# Patient Record
Sex: Female | Born: 1975 | Race: Black or African American | Hispanic: No | Marital: Single | Smoking: Current some day smoker
Health system: Southern US, Community
[De-identification: ages and names within clinical notes are randomized; demographics above are authoritative.]

## PROBLEM LIST (undated history)

## (undated) DIAGNOSIS — I10 Essential (primary) hypertension: Secondary | ICD-10-CM

## (undated) HISTORY — DX: Essential (primary) hypertension: I10

## (undated) HISTORY — PX: TUBAL LIGATION: SHX77

---

## 1997-03-04 ENCOUNTER — Inpatient Hospital Stay (HOSPITAL_COMMUNITY): Admission: AD | Admit: 1997-03-04 | Discharge: 1997-03-04 | Payer: Self-pay | Admitting: Obstetrics

## 1997-04-17 ENCOUNTER — Inpatient Hospital Stay (HOSPITAL_COMMUNITY): Admission: AD | Admit: 1997-04-17 | Discharge: 1997-04-17 | Payer: Self-pay | Admitting: Obstetrics

## 1997-04-18 ENCOUNTER — Inpatient Hospital Stay (HOSPITAL_COMMUNITY): Admission: AD | Admit: 1997-04-18 | Discharge: 1997-04-20 | Payer: Self-pay | Admitting: Obstetrics

## 1998-10-22 ENCOUNTER — Inpatient Hospital Stay (HOSPITAL_COMMUNITY): Admission: AD | Admit: 1998-10-22 | Discharge: 1998-10-22 | Payer: Self-pay | Admitting: Obstetrics & Gynecology

## 1999-06-14 ENCOUNTER — Emergency Department (HOSPITAL_COMMUNITY): Admission: EM | Admit: 1999-06-14 | Discharge: 1999-06-15 | Payer: Self-pay | Admitting: Emergency Medicine

## 1999-08-08 ENCOUNTER — Inpatient Hospital Stay (HOSPITAL_COMMUNITY): Admission: AD | Admit: 1999-08-08 | Discharge: 1999-08-08 | Payer: Self-pay | Admitting: Obstetrics

## 1999-09-08 ENCOUNTER — Encounter: Payer: Self-pay | Admitting: Obstetrics

## 1999-09-08 ENCOUNTER — Inpatient Hospital Stay (HOSPITAL_COMMUNITY): Admission: AD | Admit: 1999-09-08 | Discharge: 1999-09-09 | Payer: Self-pay | Admitting: Obstetrics

## 1999-10-08 ENCOUNTER — Emergency Department (HOSPITAL_COMMUNITY): Admission: EM | Admit: 1999-10-08 | Discharge: 1999-10-08 | Payer: Self-pay | Admitting: Emergency Medicine

## 1999-10-13 ENCOUNTER — Other Ambulatory Visit: Admission: RE | Admit: 1999-10-13 | Discharge: 1999-10-13 | Payer: Self-pay | Admitting: Obstetrics

## 2000-02-20 ENCOUNTER — Inpatient Hospital Stay (HOSPITAL_COMMUNITY): Admission: AD | Admit: 2000-02-20 | Discharge: 2000-02-20 | Payer: Self-pay | Admitting: Obstetrics

## 2000-04-03 ENCOUNTER — Observation Stay (HOSPITAL_COMMUNITY): Admission: AD | Admit: 2000-04-03 | Discharge: 2000-04-03 | Payer: Self-pay | Admitting: Obstetrics

## 2000-04-09 ENCOUNTER — Inpatient Hospital Stay (HOSPITAL_COMMUNITY): Admission: AD | Admit: 2000-04-09 | Discharge: 2000-04-13 | Payer: Self-pay | Admitting: Obstetrics

## 2000-04-12 ENCOUNTER — Encounter: Payer: Self-pay | Admitting: Obstetrics

## 2002-04-15 ENCOUNTER — Emergency Department (HOSPITAL_COMMUNITY): Admission: EM | Admit: 2002-04-15 | Discharge: 2002-04-15 | Payer: Self-pay | Admitting: *Deleted

## 2003-04-07 ENCOUNTER — Emergency Department (HOSPITAL_COMMUNITY): Admission: EM | Admit: 2003-04-07 | Discharge: 2003-04-07 | Payer: Self-pay | Admitting: *Deleted

## 2003-12-01 ENCOUNTER — Inpatient Hospital Stay (HOSPITAL_COMMUNITY): Admission: AD | Admit: 2003-12-01 | Discharge: 2003-12-01 | Payer: Self-pay | Admitting: Obstetrics and Gynecology

## 2007-07-29 ENCOUNTER — Inpatient Hospital Stay (HOSPITAL_COMMUNITY): Admission: AD | Admit: 2007-07-29 | Discharge: 2007-07-30 | Payer: Self-pay | Admitting: Gynecology

## 2007-09-18 ENCOUNTER — Emergency Department (HOSPITAL_COMMUNITY): Admission: EM | Admit: 2007-09-18 | Discharge: 2007-09-18 | Payer: Self-pay | Admitting: Emergency Medicine

## 2007-12-01 ENCOUNTER — Emergency Department (HOSPITAL_COMMUNITY): Admission: EM | Admit: 2007-12-01 | Discharge: 2007-12-01 | Payer: Self-pay | Admitting: Emergency Medicine

## 2008-03-04 ENCOUNTER — Emergency Department (HOSPITAL_COMMUNITY): Admission: EM | Admit: 2008-03-04 | Discharge: 2008-03-04 | Payer: Self-pay | Admitting: Emergency Medicine

## 2008-05-07 ENCOUNTER — Emergency Department (HOSPITAL_COMMUNITY): Admission: EM | Admit: 2008-05-07 | Discharge: 2008-05-07 | Payer: Self-pay | Admitting: Emergency Medicine

## 2008-06-26 ENCOUNTER — Emergency Department (HOSPITAL_COMMUNITY): Admission: EM | Admit: 2008-06-26 | Discharge: 2008-06-26 | Payer: Self-pay | Admitting: Emergency Medicine

## 2008-10-03 ENCOUNTER — Emergency Department (HOSPITAL_COMMUNITY): Admission: EM | Admit: 2008-10-03 | Discharge: 2008-10-03 | Payer: Self-pay | Admitting: Emergency Medicine

## 2009-04-23 ENCOUNTER — Emergency Department (HOSPITAL_COMMUNITY): Admission: EM | Admit: 2009-04-23 | Discharge: 2009-04-23 | Payer: Self-pay | Admitting: Emergency Medicine

## 2009-04-29 ENCOUNTER — Emergency Department (HOSPITAL_COMMUNITY): Admission: EM | Admit: 2009-04-29 | Discharge: 2009-04-29 | Payer: Self-pay | Admitting: Emergency Medicine

## 2009-05-06 ENCOUNTER — Emergency Department (HOSPITAL_COMMUNITY): Admission: EM | Admit: 2009-05-06 | Discharge: 2009-05-06 | Payer: Self-pay | Admitting: Emergency Medicine

## 2009-08-08 ENCOUNTER — Emergency Department (HOSPITAL_COMMUNITY): Admission: EM | Admit: 2009-08-08 | Discharge: 2009-08-08 | Payer: Self-pay | Admitting: Emergency Medicine

## 2009-09-05 ENCOUNTER — Emergency Department (HOSPITAL_COMMUNITY): Admission: EM | Admit: 2009-09-05 | Discharge: 2009-09-05 | Payer: Self-pay | Admitting: Emergency Medicine

## 2009-11-03 ENCOUNTER — Emergency Department (HOSPITAL_COMMUNITY): Admission: EM | Admit: 2009-11-03 | Discharge: 2009-11-03 | Payer: Self-pay | Admitting: Emergency Medicine

## 2009-12-31 ENCOUNTER — Emergency Department (HOSPITAL_COMMUNITY)
Admission: EM | Admit: 2009-12-31 | Discharge: 2009-12-31 | Payer: Self-pay | Source: Home / Self Care | Admitting: Emergency Medicine

## 2010-03-01 ENCOUNTER — Emergency Department (HOSPITAL_COMMUNITY): Payer: Self-pay

## 2010-03-01 ENCOUNTER — Emergency Department (HOSPITAL_COMMUNITY)
Admission: EM | Admit: 2010-03-01 | Discharge: 2010-03-01 | Disposition: A | Discharge status: Home / Self Care | Payer: Self-pay | Attending: Emergency Medicine | Admitting: Emergency Medicine

## 2010-03-01 DIAGNOSIS — R059 Cough, unspecified: Secondary | ICD-10-CM | POA: Insufficient documentation

## 2010-03-01 DIAGNOSIS — R05 Cough: Secondary | ICD-10-CM | POA: Insufficient documentation

## 2010-03-01 DIAGNOSIS — J3489 Other specified disorders of nose and nasal sinuses: Secondary | ICD-10-CM | POA: Insufficient documentation

## 2010-03-01 DIAGNOSIS — R51 Headache: Secondary | ICD-10-CM | POA: Insufficient documentation

## 2010-03-01 DIAGNOSIS — I10 Essential (primary) hypertension: Secondary | ICD-10-CM | POA: Insufficient documentation

## 2010-03-01 DIAGNOSIS — J40 Bronchitis, not specified as acute or chronic: Secondary | ICD-10-CM | POA: Insufficient documentation

## 2010-03-01 DIAGNOSIS — IMO0001 Reserved for inherently not codable concepts without codable children: Secondary | ICD-10-CM | POA: Insufficient documentation

## 2010-03-01 DIAGNOSIS — R6883 Chills (without fever): Secondary | ICD-10-CM | POA: Insufficient documentation

## 2010-04-16 LAB — POCT I-STAT, CHEM 8
Creatinine, Ser: 0.7 mg/dL (ref 0.4–1.2)
Glucose, Bld: 85 mg/dL (ref 70–99)
HCT: 40 % (ref 36.0–46.0)
Hemoglobin: 13.6 g/dL (ref 12.0–15.0)
Sodium: 142 mEq/L (ref 135–145)
TCO2: 24 mmol/L (ref 0–100)

## 2010-04-30 LAB — URINE CULTURE

## 2010-04-30 LAB — BASIC METABOLIC PANEL
GFR calc Af Amer: >60 mL/min (ref >60)
GFR calc non Af Amer: >60 mL/min (ref >60)
Potassium: 3.4 mEq/L — ABNORMAL LOW (ref 3.5–5.1)
Sodium: 139 mEq/L (ref 135–145)

## 2010-04-30 LAB — URINE MICROSCOPIC-ADD ON

## 2010-04-30 LAB — DIFFERENTIAL
Eosinophils Relative: 3 % (ref 0–5)
Lymphocytes Relative: 32 % (ref 12–46)
Lymphs Abs: 2 10*3/uL (ref 0.7–4.0)
Monocytes Absolute: 0.2 10*3/uL (ref 0.1–1.0)

## 2010-04-30 LAB — URINALYSIS, ROUTINE W REFLEX MICROSCOPIC
Glucose, UA: NEGATIVE mg/dL
Ketones, ur: NEGATIVE mg/dL
Protein, ur: NEGATIVE mg/dL

## 2010-04-30 LAB — CBC
HCT: 35.2 % — ABNORMAL LOW (ref 36.0–46.0)
Hemoglobin: 12 g/dL (ref 12.0–15.0)
Platelets: 171 10*3/uL (ref 150–400)
RBC: 3.71 MIL/uL — ABNORMAL LOW (ref 3.87–5.11)
WBC: 6.4 10*3/uL (ref 4.0–10.5)

## 2010-05-05 LAB — URINALYSIS, ROUTINE W REFLEX MICROSCOPIC
Nitrite: NEGATIVE
Specific Gravity, Urine: 1.028 (ref 1.005–1.030)
Urobilinogen, UA: 1 mg/dL (ref 0.0–1.0)

## 2010-05-05 LAB — URINE CULTURE: Colony Count: 40000

## 2010-05-05 LAB — URINE MICROSCOPIC-ADD ON

## 2010-05-11 LAB — POCT URINALYSIS DIP (DEVICE)
Hgb urine dipstick: NEGATIVE
Protein, ur: NEGATIVE mg/dL
Specific Gravity, Urine: 1.02 (ref 1.005–1.030)
Urobilinogen, UA: 0.2 mg/dL (ref 0.0–1.0)
pH: 6 (ref 5.0–8.0)

## 2010-06-11 NOTE — Op Note (Signed)
[facility]  Patient:    Dawn Walsh, Dawn Walsh                     MRN: [id number] Proc. Date: 04/10/00 Adm. Date:  [id number] Attending:  Venita Sheffield                           Operative Report  PREOPERATIVE DIAGNOSIS:       Multiparity.  POSTOPERATIVE DIAGNOSIS:  OPERATION:                    Postpartum tubal using epidural.  SURGEON:                      Kathreen Cosier, M.D.  ASSISTANT:  ANESTHESIA:                   Epidural.  ESTIMATED BLOOD LOSS:  DESCRIPTION OF PROCEDURE:     With the patient in the supine position, the abdomen was prepped and draped.  The bladder was emptied with a straight catheter.  Midline subumbilical incision one inch long was made and carried down to the fascia.  The fascia was cleaned and grasped with two Kochers.  The fascia and peritoneum were opened with Mayo scissors.  The left tube grasped in the midportion with the Babcock clamp.  0 plain suture placed in the mesosalpinx below the portion of the tube within the clamp.  This was transected.  After the pedicle was tied with 0 plain suture.  Hemostasis was satisfactory.  The procedure was done in similar fashion on the other side. Abdomen closed in layers.  Peritoneum and fascia with continuous suture of 0 Dexon.  The skin closed with subcuticular stitch of 3-0 plain.  The patient tolerated the procedure well and was taken to the recovery room in good condition. DD:  04/10/00 TD:  04/10/00 Job: [id number]

## 2010-06-11 NOTE — Discharge Summary (Signed)
[facility]  Patient:    Dawn Walsh, Dawn Walsh                     MRN: [id number] Adm. Date:  [id number] Disc. Date: [id number] Attending:  Venita Sheffield                           Discharge Summary  HISTORY OF PRESENT ILLNESS:   The patient is a 35 year old, gravida 3, para 3-0-3, with a normal vaginal delivery on April 09, 2000.  She desires tubal ligation and on April 10, 2000 underwent postpartum tubal.  She started spiking a temperature of 101.0 or greater on the evening of April 11, 2000.  She was started on ampicillin p.o. q.6h. but continued spiking.  On April 12, 2000, she was started on IV ampicillin, Cleocin, and gentamicin. She had an HIV test which was negative.  Her ultrasound was done checking for routine polyps and the ultrasound was negative as was pathology.  Her abdomen is soft.  She had good bowel sounds. She moved about.  No nausea, vomiting, no CVA tenderness.  Urine was sent and she had a 11 to 20 white blood cells.  Her hemoglobin on admission was 10.4, white count 15,000.  Daily CBCs showed her white count never exceeded 15,000 and came down to 10,000 on March 25, 2000.  The remainder of her labs were normal.  The HIV was normal.  Physical exam was within normal limits.  Pelvic exam showed the lochia was nonfoul smelling and normal in appearance.  The patient was seen in consult with infectious diseases, Dr. Tinnie Gens C. Hatcher.  Their examination was also negative.  The patient had multiple social problems.  She stated that she had no one to take care of her children at home among other problems and was very insistent about being discharged.  I consulted with Dr. Tinnie Gens C. Hatcher on April 13, 2000 and he agreed we could send her home on Cleocin 300 mg every six hours, p.o. ampicillin 500 mg p.o. every six hours.  The patient is to take her temperature every four to six hours at home and if her temperature is 101.0  or greater she is to return to the hospital for admission.  She is also to follow up in my office on Monday morning, for a temperature check, and re-examination.  DISCHARGE DIAGNOSES:          1. Status post normal vaginal delivery.                               2. Postpartum tubal ligation. DD:  04/13/00 TD:  04/13/00 Job: [id number]

## 2010-09-05 IMAGING — CR DG LUMBAR SPINE COMPLETE 4+V
5 series · 5 of 5 positions shown · non-contrast
Comparison: Abdominal radiographs 05/07/2008

CLINICAL DATA: Back pain and lower abdominal pain

LUMBAR SPINE - COMPLETE 4+ VIEW

[t l-spine a.p.]
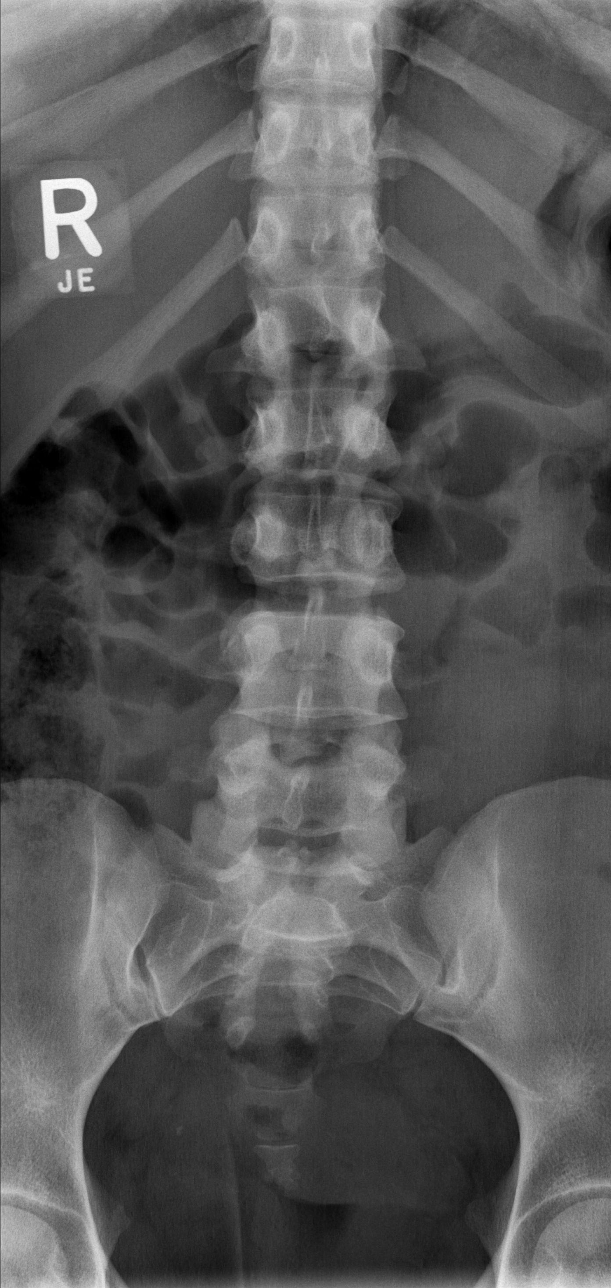

[t l-spine oblique exposure (1 of 2)]
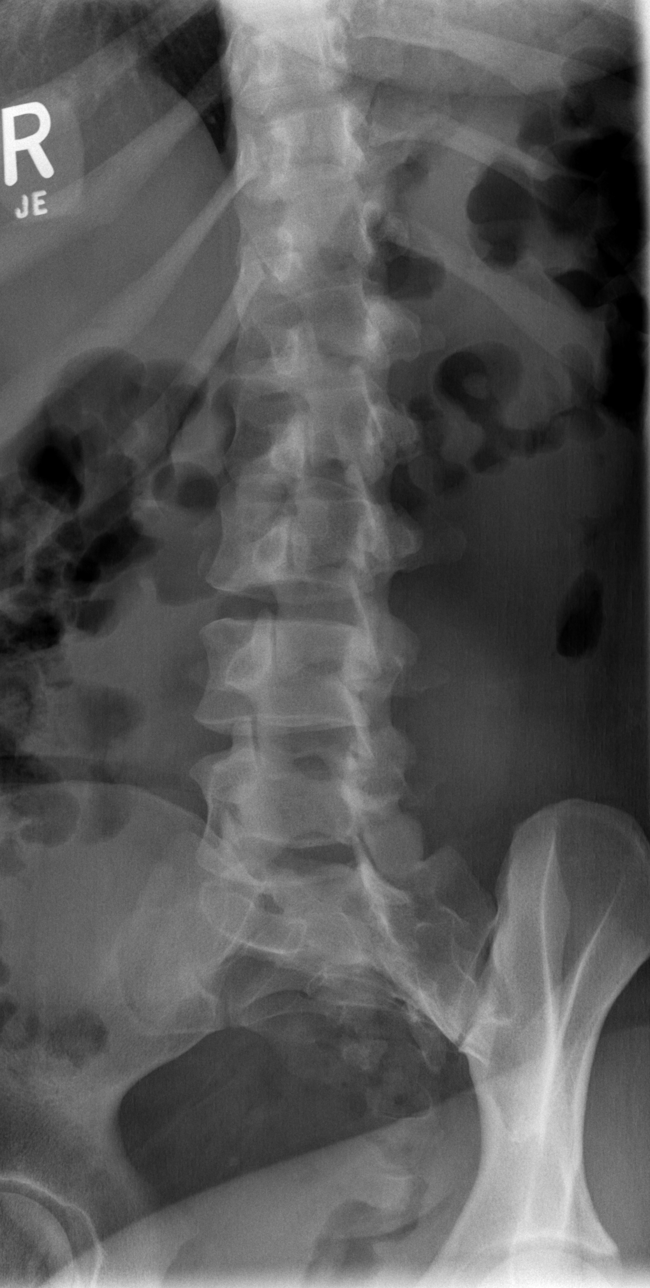

[t l-spine oblique exposure (2 of 2)]
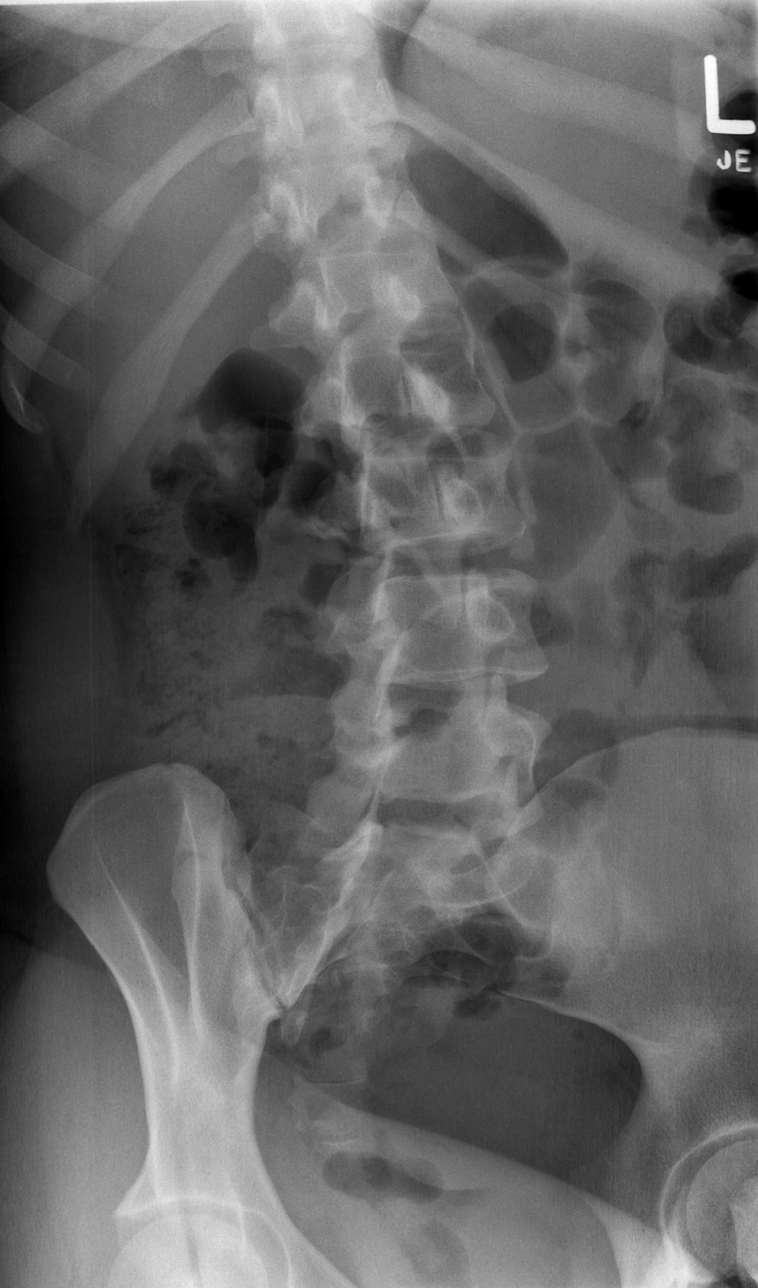

[t l-spine lat *]
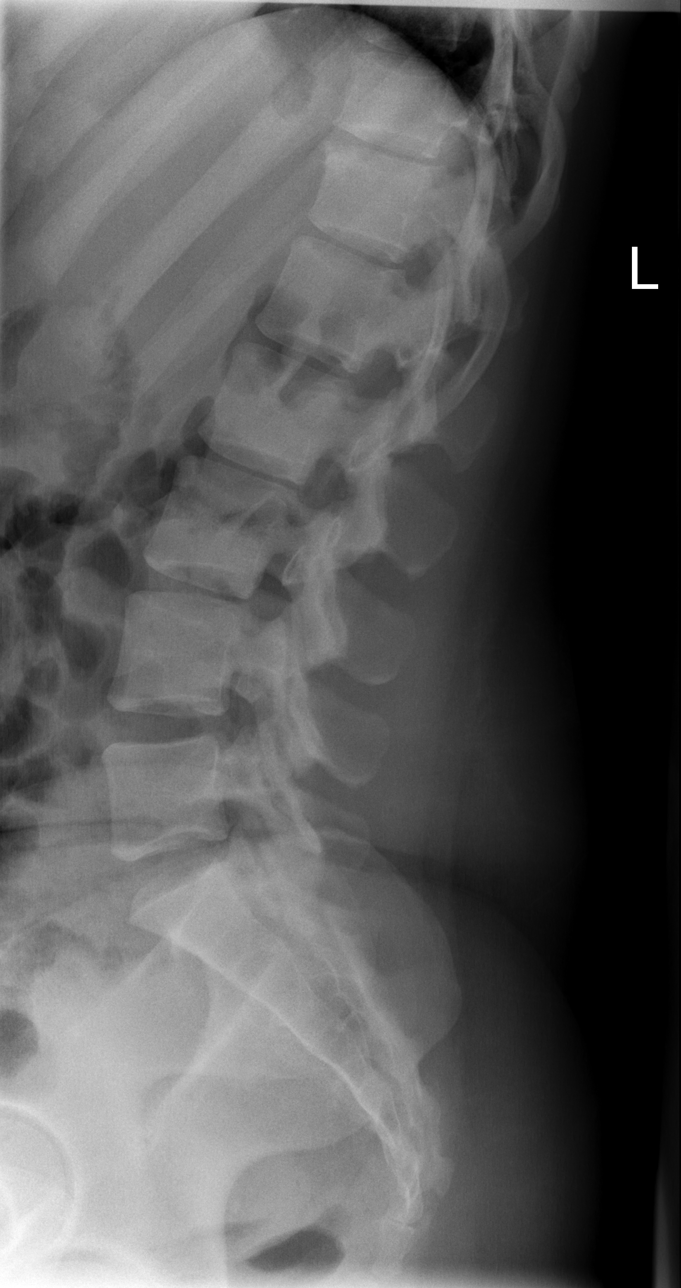

[t l-spine l5-s1 spot]
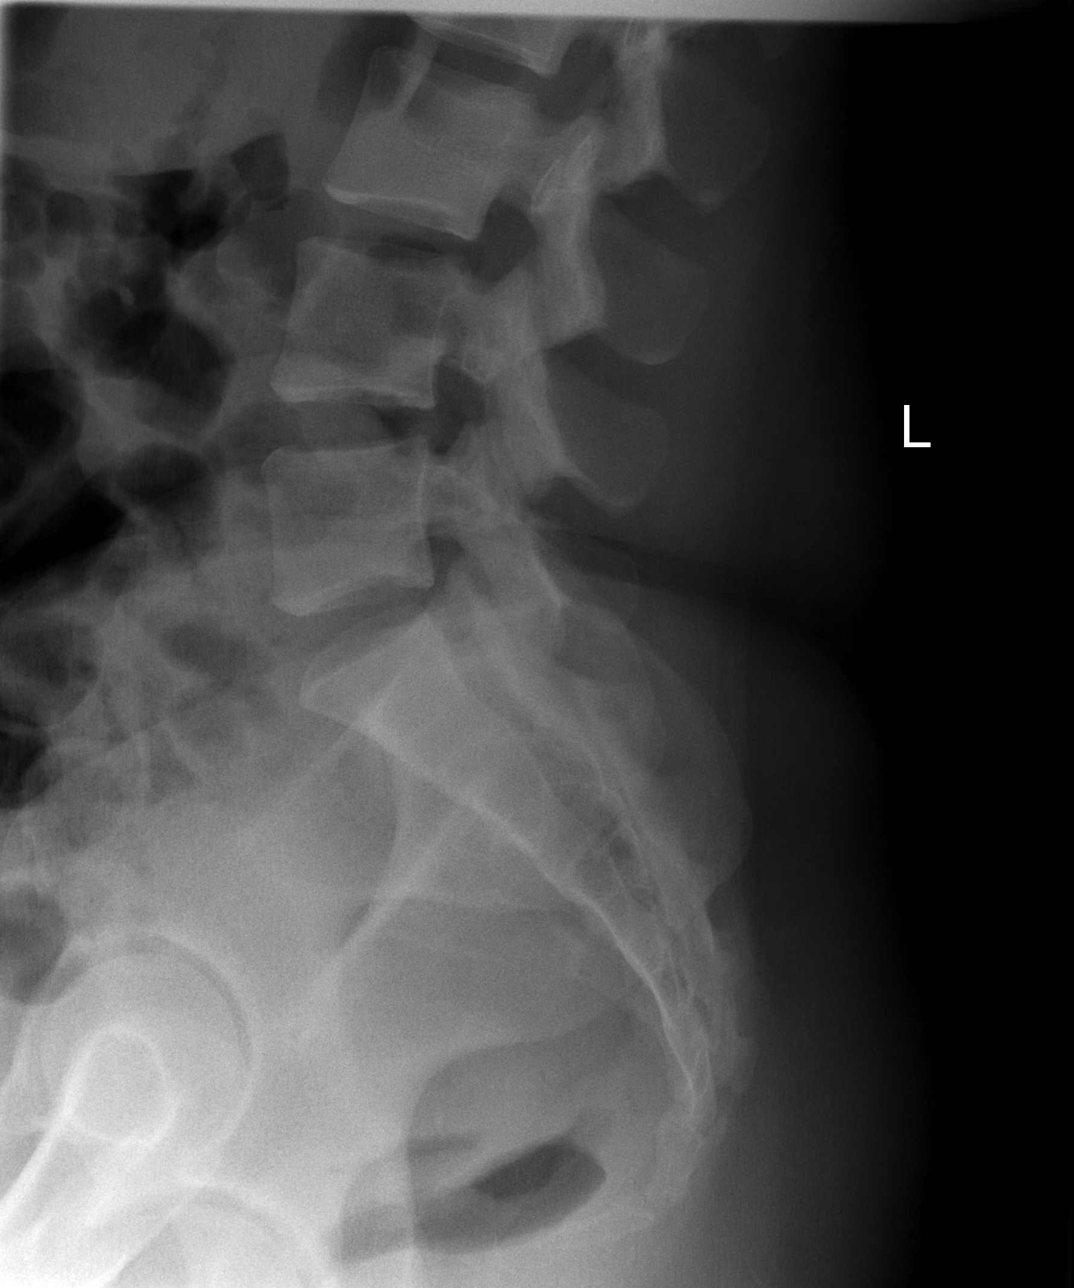

[5 of 5 positions shown; findings below may reference images not displayed]

FINDINGS: There are five lumbar-type vertebral bodies.  Lumbar
vertebral bodies are normal in height and alignment.  Bony
mineralization appears normal.  The disc spaces are maintained.  No
significant degenerative change is identified.  There is no
evidence of a pars defect.  The sacral arcs and sacroiliac joints
are unremarkable. A phlebolith in the right pelvis is noted.
IMPRESSION: Negative lumbar spine radiographs.

## 2010-10-21 LAB — WET PREP, GENITAL: Yeast Wet Prep HPF POC: NONE SEEN

## 2010-10-21 LAB — URINALYSIS, ROUTINE W REFLEX MICROSCOPIC
Glucose, UA: NEGATIVE
Hgb urine dipstick: NEGATIVE
Ketones, ur: NEGATIVE
Protein, ur: NEGATIVE

## 2010-10-21 LAB — GC/CHLAMYDIA PROBE AMP, GENITAL
Chlamydia, DNA Probe: POSITIVE — AB
GC Probe Amp, Genital: NEGATIVE

## 2010-10-21 LAB — POCT PREGNANCY, URINE
Operator id: 120871
Preg Test, Ur: NEGATIVE

## 2010-10-21 LAB — URINE MICROSCOPIC-ADD ON

## 2010-10-26 LAB — DIFFERENTIAL
Basophils Absolute: 0.1
Lymphocytes Relative: 38
Monocytes Absolute: 0.4
Neutro Abs: 3.7

## 2010-10-26 LAB — URINALYSIS, ROUTINE W REFLEX MICROSCOPIC
Glucose, UA: NEGATIVE
Ketones, ur: 15 — AB
Specific Gravity, Urine: 1.028
pH: 6

## 2010-10-26 LAB — POCT PREGNANCY, URINE: Preg Test, Ur: NEGATIVE

## 2010-10-26 LAB — POCT I-STAT, CHEM 8
BUN: 8
Calcium, Ion: 1.11 — ABNORMAL LOW
Chloride: 109
Potassium: 3.4 — ABNORMAL LOW

## 2010-10-26 LAB — URINE MICROSCOPIC-ADD ON

## 2010-10-26 LAB — CBC
Hemoglobin: 12.5
RBC: 3.87
RDW: 14

## 2012-06-01 ENCOUNTER — Encounter (HOSPITAL_COMMUNITY): Payer: Self-pay | Admitting: Emergency Medicine

## 2012-06-01 ENCOUNTER — Emergency Department (HOSPITAL_COMMUNITY)
Admission: EM | Admit: 2012-06-01 | Discharge: 2012-06-01 | Disposition: A | Discharge status: Home / Self Care | Payer: Self-pay | Attending: Emergency Medicine | Admitting: Emergency Medicine

## 2012-06-01 DIAGNOSIS — Z7982 Long term (current) use of aspirin: Secondary | ICD-10-CM | POA: Insufficient documentation

## 2012-06-01 DIAGNOSIS — F17200 Nicotine dependence, unspecified, uncomplicated: Secondary | ICD-10-CM | POA: Insufficient documentation

## 2012-06-01 DIAGNOSIS — R42 Dizziness and giddiness: Secondary | ICD-10-CM | POA: Insufficient documentation

## 2012-06-01 DIAGNOSIS — Z3202 Encounter for pregnancy test, result negative: Secondary | ICD-10-CM | POA: Insufficient documentation

## 2012-06-01 LAB — URINALYSIS, ROUTINE W REFLEX MICROSCOPIC
Protein, ur: NEGATIVE mg/dL
Specific Gravity, Urine: 1.028 (ref 1.005–1.030)
pH: 6 (ref 5.0–8.0)

## 2012-06-01 LAB — CBC WITH DIFFERENTIAL/PLATELET
Basophils Absolute: 0 10*3/uL (ref 0.0–0.1)
Basophils Relative: 1 % (ref 0–1)
Eosinophils Absolute: 0.2 10*3/uL (ref 0.0–0.7)
MCH: 30.4 pg (ref 26.0–34.0)
MCHC: 33.4 g/dL (ref 30.0–36.0)
Neutro Abs: 3.3 10*3/uL (ref 1.7–7.7)
Neutrophils Relative %: 53 % (ref 43–77)
Platelets: 207 10*3/uL (ref 150–400)
RDW: 14 % (ref 11.5–15.5)

## 2012-06-01 LAB — URINE MICROSCOPIC-ADD ON

## 2012-06-01 LAB — BASIC METABOLIC PANEL
Chloride: 110 mEq/L (ref 96–112)
GFR calc Af Amer: >90 mL/min (ref >90)
GFR calc non Af Amer: 84 mL/min — ABNORMAL LOW (ref >90)
Potassium: 3.7 mEq/L (ref 3.5–5.1)

## 2012-06-01 LAB — PREGNANCY, URINE: Preg Test, Ur: NEGATIVE

## 2012-06-01 MED ORDER — SODIUM CHLORIDE 0.9 % IV BOLUS (SEPSIS)
1000.0000 mL | Freq: Once | INTRAVENOUS | Status: AC
  Administered 2012-06-01: 1000 mL via INTRAVENOUS

## 2012-06-01 NOTE — ED Notes (Signed)
MD at bedside. 

## 2012-06-01 NOTE — ED Notes (Signed)
Dizziness and not feeling rt all week got worse today

## 2012-06-01 NOTE — ED Provider Notes (Signed)
History     CSN: [id number]  Arrival date & time 06/01/12  1048   First MD Initiated Contact with Patient 06/01/12 1127      Chief Complaint  Patient presents with  . Dizziness    (Consider location/radiation/quality/duration/timing/severity/associated sxs/prior treatment) HPI Pt with no significant PMH reports she was feeling dizzy this morning when she woke. Described as both room-spinning and lightheaded at times, not associated with blurry vision, headache, CP or SOB. She sat down and rested and symptoms improved. She states she checked BP at work and it was ~140/90. Denies any recent fever, N/V/D dysuria. Has had good appetite.  History reviewed. No pertinent past medical history.  No past surgical history on file.  No family history on file.  History  Substance Use Topics  . Smoking status: Current Every Day Smoker  . Smokeless tobacco: Not on file  . Alcohol Use: Yes    OB History   Grav Para Term Preterm Abortions TAB SAB Ect Mult Living                  Review of Systems All other systems reviewed and are negative except as noted in HPI.   Allergies  Morphine and related  Home Medications   Current Outpatient Rx  Name  Route  Sig  Dispense  Refill  . aspirin 325 MG tablet   Oral   Take 650 mg by mouth daily.         Marland Kitchen ibuprofen (ADVIL,MOTRIN) 200 MG tablet   Oral   Take 400 mg by mouth every 6 (six) hours as needed for pain.           BP 123/87  Pulse 87  Temp(Src) 98.4 F (36.9 C)  Resp 16  SpO2 99%  Physical Exam  Nursing note and vitals reviewed. Constitutional: She is oriented to person, place, and time. She appears well-developed and well-nourished.  HENT:  Head: Normocephalic and atraumatic.  Eyes: EOM are normal. Pupils are equal, round, and reactive to light.  Neck: Normal range of motion. Neck supple.  Cardiovascular: Normal rate, normal heart sounds and intact distal pulses.   Pulmonary/Chest: Effort normal and breath sounds  normal.  Abdominal: Bowel sounds are normal. She exhibits no distension. There is no tenderness.  Musculoskeletal: Normal range of motion. She exhibits no edema and no tenderness.  Neurological: She is alert and oriented to person, place, and time. She has normal strength. No cranial nerve deficit or sensory deficit.  Skin: Skin is warm and dry. No rash noted.  Psychiatric: She has a normal mood and affect.    ED Course  Procedures (including critical care time)  Labs Reviewed  CBC WITH DIFFERENTIAL - Abnormal; Notable for the following:    RBC 3.82 (*)    Hemoglobin 11.6 (*)    HCT 34.7 (*)    All other components within normal limits  BASIC METABOLIC PANEL - Abnormal; Notable for the following:    GFR calc non Af Amer 84 (*)    All other components within normal limits  URINALYSIS, ROUTINE W REFLEX MICROSCOPIC - Abnormal; Notable for the following:    APPearance HAZY (*)    Hgb urine dipstick SMALL (*)    Leukocytes, UA SMALL (*)    All other components within normal limits  URINE MICROSCOPIC-ADD ON - Abnormal; Notable for the following:    Squamous Epithelial / LPF MANY (*)    Bacteria, UA FEW (*)    All other components  within normal limits  URINE CULTURE  PREGNANCY, URINE   No results found.   1. Dizziness       MDM   Date: 06/01/2012  Rate: 88  Rhythm: normal sinus rhythm  QRS Axis: normal  Intervals: normal  ST/T Wave abnormalities: normal  Conduction Disutrbances: none  Narrative Interpretation: unremarkable  Labs and EKG unremarkable. No longer feeling dizzy. Ambulates without difficulty. Advised PCP followup. Increased PO intake and PCP followup.           Drayson Dorko B. Bernette Mayers, MD 06/01/12 1414

## 2012-06-02 LAB — URINE CULTURE

## 2015-03-09 ENCOUNTER — Encounter (HOSPITAL_COMMUNITY): Payer: Self-pay | Admitting: *Deleted

## 2015-03-09 ENCOUNTER — Emergency Department (HOSPITAL_COMMUNITY)
Admission: EM | Admit: 2015-03-09 | Discharge: 2015-03-09 | Disposition: A | Discharge status: Home / Self Care | Payer: Self-pay | Attending: Emergency Medicine | Admitting: Emergency Medicine

## 2015-03-09 DIAGNOSIS — K029 Dental caries, unspecified: Secondary | ICD-10-CM | POA: Insufficient documentation

## 2015-03-09 DIAGNOSIS — Z7982 Long term (current) use of aspirin: Secondary | ICD-10-CM | POA: Insufficient documentation

## 2015-03-09 DIAGNOSIS — K002 Abnormalities of size and form of teeth: Secondary | ICD-10-CM | POA: Insufficient documentation

## 2015-03-09 DIAGNOSIS — K047 Periapical abscess without sinus: Secondary | ICD-10-CM

## 2015-03-09 DIAGNOSIS — F17210 Nicotine dependence, cigarettes, uncomplicated: Secondary | ICD-10-CM | POA: Insufficient documentation

## 2015-03-09 MED ORDER — DEXAMETHASONE SODIUM PHOSPHATE 10 MG/ML IJ SOLN
10.0000 mg | Freq: Once | INTRAMUSCULAR | Status: AC
  Administered 2015-03-09: 10 mg via INTRAVENOUS
  Filled 2015-03-09: qty 1

## 2015-03-09 MED ORDER — OXYCODONE-ACETAMINOPHEN 5-325 MG PO TABS: 2.0000 | ORAL_TABLET | ORAL | Status: DC | PRN

## 2015-03-09 MED ORDER — PENICILLIN V POTASSIUM 250 MG PO TABS: 500.0000 mg | ORAL_TABLET | Freq: Four times a day (QID) | ORAL | Status: AC

## 2015-03-09 NOTE — ED Provider Notes (Signed)
CSN: [id number]     Arrival date & time 03/09/15  1750 History  By signing my name below, I, Soijett Blue, attest that this documentation has been prepared under the direction and in the presence of Donnald Garre, PA-C Electronically Signed: Soijett Blue, ED Scribe. 03/09/2015. 6:28 PM.   Chief Complaint  Patient presents with  . Oral Swelling  . Dental Problem      The history is provided by the patient. No language interpreter was used.    Dawn Walsh is a 40 y.o. female who presents to the Emergency Department complaining of right lower dental pain onset 1 week worsening 2 days ago. Pt reports that her right upper dental pain began 2 weeks ago to which she tried to use an OTC dental filling kit with no success. She states that she is having associated symptoms of right lower facial swelling x 2 days, right sided HA, warmth to right lower jaw, painful swallowing, and sore throat x last night. She has tried Rx amoxicillin, 800 mg ibuprofen with no relief for her symptoms. She denies drainage, trouble swallowing, drooling, fever, and any other symptoms. Pt states that she is allergic to morphine.    History reviewed. No pertinent past medical history. History reviewed. No pertinent past surgical history. No family history on file. Social History  Substance Use Topics  . Smoking status: Current Every Day Smoker    Types: Cigarettes, Cigars  . Smokeless tobacco: None  . Alcohol Use: No   OB History    No data available     Review of Systems  A complete 10 system review of systems was obtained and all systems are negative except as noted in the HPI and PMH.   Allergies  Morphine and related  Home Medications   Prior to Admission medications   Medication Sig Start Date End Date Taking? Authorizing Provider  aspirin 325 MG tablet Take 650 mg by mouth daily.    Historical Provider, MD  ibuprofen (ADVIL,MOTRIN) 200 MG tablet Take 400 mg by mouth every 6 (six) hours as  needed for pain.    Historical Provider, MD   BP 138/94 mmHg  Pulse 86  Temp(Src) 98.4 F (36.9 C) (Oral)  Resp 16  SpO2 97%  LMP 03/02/2015 Physical Exam  Constitutional: She is oriented to person, place, and time. She appears well-developed and well-nourished. No distress.  HENT:  Head: Normocephalic and atraumatic.  Mouth/Throat: Uvula is midline, oropharynx is clear and moist and mucous membranes are normal. There is trismus in the jaw. Abnormal dentition. Dental caries present. No dental abscesses or uvula swelling.  Mild right sided facial swelling.   Eyes: EOM are normal.  Neck: Neck supple.  Cardiovascular: Normal rate.   Pulmonary/Chest: Effort normal. No respiratory distress.  Musculoskeletal: Normal range of motion.  Neurological: She is alert and oriented to person, place, and time.  Skin: Skin is warm and dry.  Psychiatric: She has a normal mood and affect. Her behavior is normal.  Nursing note and vitals reviewed.   ED Course  Procedures (including critical care time) DIAGNOSTIC STUDIES: Oxygen Saturation is 97% on RA, nl by my interpretation.    COORDINATION OF CARE: 6:28 PM Discussed treatment plan with pt at bedside which includes decadron injection, penicillin Rx, percocet Rx, and referral and follow up with dentist, and pt agreed to plan.    Labs Review Labs Reviewed - No data to display  Imaging Review No results found.    EKG Interpretation  None      MDM   Final diagnoses:  Dental infection   Patient with dentalgia.  No abscess requiring immediate incision and drainage.  Exam not concerning for Ludwig's angina or pharyngeal abscess.  Will treat with decadron injection, penicillin Rx, and percocet Rx. Pt instructed to follow-up with dentist.  Discussed return precautions. Pt safe for discharge.   I personally performed the services described in this documentation, which was scribed in my presence. The recorded information has been reviewed  and is accurate.     Dondra Spry [place], PA-C 03/09/15 1856  Davonna Belling, MD 03/09/15 [phone]

## 2015-03-09 NOTE — ED Notes (Signed)
Pt reports R lower and R upper dental pain x 1 week.  Reports swelling x 2 days ago.

## 2015-03-09 NOTE — Discharge Instructions (Signed)
Dental Pain Dental pain may be caused by many things, including:  Tooth decay (cavities or caries). Cavities expose the nerve of your tooth to air and hot or cold temperatures. This can cause pain or discomfort.  Abscess or infection. A dental abscess is a collection of infected pus from a bacterial infection in the inner part of the tooth (pulp). It usually occurs at the end of the tooth's root.  Injury.  An unknown reason (idiopathic). Your pain may be mild or severe. It may only occur when:  You are chewing.  You are exposed to hot or cold temperature.  You are eating or drinking sugary foods or beverages, such as soda or candy. Your pain may also be constant. HOME CARE INSTRUCTIONS Watch your dental pain for any changes. The following actions may help to lessen any discomfort that you are feeling:  Take medicines only as directed by your dentist.  If you were prescribed an antibiotic medicine, finish all of it even if you start to feel better.  Keep all follow-up visits as directed by your dentist. This is important.  Do not apply heat to the outside of your face.  Rinse your mouth or gargle with salt water if directed by your dentist. This helps with pain and swelling.  You can make salt water by adding  tsp of salt to 1 cup of warm water.  Apply ice to the painful area of your face:  Put ice in a plastic bag.  Place a towel between your skin and the bag.  Leave the ice on for 20 minutes, 2-3 times per day.  Avoid foods or drinks that cause you pain, such as:  Very hot or very cold foods or drinks.  Sweet or sugary foods or drinks. SEEK MEDICAL CARE IF:  Your pain is not controlled with medicines.  Your symptoms are worse.  You have new symptoms. SEEK IMMEDIATE MEDICAL CARE IF:  You are unable to open your mouth.  You are having trouble breathing or swallowing.  You have a fever.  Your face, neck, or jaw is swollen.   This information is not  intended to replace advice given to you by your health care provider. Make sure you discuss any questions you have with your health care provider.  [employer] [employer] [facility] [employer] is a great source of information about community services available.  Access by dialing 2-1-1 from anywhere in [place], or by website -  [employer]ian[employer]fi.   Other Local Resources (Updated 01/2015)  Dental  Care   Services    Phone Number and Address  Cost  [facility] For children 61 - 63 years of age:   Cleaning  Tooth brushing/flossing instruction  Sealants, fillings, crowns  Extractions  Emergency treatment  [phone] [address], [place] [address] Charges based on family income.  Medicaid and some insurance plans accepted.     [facility] - [facility], fillings, crowns  Extractions  Emergency treatment [phone] [place], [place]  Pregnant women 64 years of age or older with a Medicaid card  [facility] - [place]  Cleaning  Sealants, fillings, crowns  Extractions  Emergency treatment [phone] [address] [place], [place] Pregnant women 76 years of age or older with a Medicaid card  [facility] For children 0 - 21 years  of age:   Cleaning  Tooth brushing/flossing instruction  Sealants, fillings, crowns  Extractions  Emergency treatment Limited orthodontic services for patients with Medicaid [phone] [address] [place], [place] [address] Medicaid and [facility] Health Choice cover for children up to age 68 and pregnant women.  Parents of children up to age 33 without Medicaid pay a reduced fee at time of service.  [facility] [employer] For children 34 - 55 years of age:   Cleaning  Tooth  brushing/flossing instruction  Sealants, fillings, crowns  Extractions  Emergency treatment Limited orthodontic services for patients with Medicaid [phone] [place], [place].  Medicaid and [place] Health Choice cover for children up to age 30 and pregnant women.  Parents of children up to age 11 without Medicaid pay a reduced fee.  [facility]  Sealants, fillings, crowns  Extractions  Hours: Tuesdays and Thursdays, 4:15 - 8 pm [phone] [address] [place], [place] [address] Services free of charge to [facility] residents ages 18-64 who do not have health insurance, Medicare, [place], or [place] benefits and fall within federal poverty guidelines  [employer]    Provides dental care in addition to primary medical care, nutritional counseling, and pharmacy:  Nurse, mental health, fillings, crowns  Extractions                  [phone] [facility], [address] [place], [place]  [phone] [facility], [address] [place] [place], [place]  [phone] [facility] [place], [place]  [phone] [facility], [address] [place], [place]  [phone] [facility] [address] [place], [place] Accepts [place], [place], most insurance.  Also provides services available to all with fees adjusted based on ability to pay.    [facility]  Cleaning  Tooth brushing/flossing instruction  Sealants, fillings, crowns  Extractions  Emergency treatment Hours: Tuesdays, Thursdays, and Fridays from 8 am to 5 pm by appointment only. [phone] [address] [place], [place] [address] [facility] residents with Medicaid (depending on eligibility) and children with [facility] Health Choice - call for more information.  [facility]  Extractions only  Hours: 2nd and 4th Thursday of each month from 6:30 am - 9 am.   [phone]. [phone] [place], [place] [address] Ages 56 and older only.  Patients are seen on a first come, first served basis.  [employer]  [employer]  Extractions  Orthodontics  Endodontics  Implants/Crowns/Bridges  Complete and partial dentures [phone] [place], [place] Patients must complete an application for services.  There is often a waiting list.    Follow up with dentist as soon as possible for consultation and re-evaluation. Take antibiotics as prescribed. Return to the ED if you experience increased facial swelling, difficulty swallowing, difficulty breathing, fevers, chills.

## 2017-02-24 ENCOUNTER — Encounter (HOSPITAL_COMMUNITY): Payer: Self-pay | Admitting: *Deleted

## 2017-02-24 ENCOUNTER — Emergency Department (HOSPITAL_COMMUNITY)
Admission: EM | Admit: 2017-02-24 | Discharge: 2017-02-24 | Disposition: A | Discharge status: Home / Self Care | Payer: Self-pay | Attending: Emergency Medicine | Admitting: Emergency Medicine

## 2017-02-24 ENCOUNTER — Other Ambulatory Visit: Payer: Self-pay

## 2017-02-24 DIAGNOSIS — H1032 Unspecified acute conjunctivitis, left eye: Secondary | ICD-10-CM | POA: Insufficient documentation

## 2017-02-24 DIAGNOSIS — F17210 Nicotine dependence, cigarettes, uncomplicated: Secondary | ICD-10-CM | POA: Insufficient documentation

## 2017-02-24 DIAGNOSIS — Z7982 Long term (current) use of aspirin: Secondary | ICD-10-CM | POA: Insufficient documentation

## 2017-02-24 MED ORDER — ERYTHROMYCIN 5 MG/GM OP OINT: TOPICAL_OINTMENT | OPHTHALMIC | 0 refills | Status: DC

## 2017-02-24 MED ORDER — IBUPROFEN 800 MG PO TABS
800.0000 mg | ORAL_TABLET | Freq: Once | ORAL | Status: AC
  Administered 2017-02-24: 800 mg via ORAL
  Filled 2017-02-24: qty 1

## 2017-02-24 MED ORDER — FLUORESCEIN SODIUM 1 MG OP STRP
1.0000 | ORAL_STRIP | Freq: Once | OPHTHALMIC | Status: AC
  Administered 2017-02-24: 1 via OPHTHALMIC
  Filled 2017-02-24: qty 1

## 2017-02-24 MED ORDER — IBUPROFEN 800 MG PO TABS: 800.0000 mg | ORAL_TABLET | Freq: Three times a day (TID) | ORAL | 0 refills | Status: DC | PRN

## 2017-02-24 MED ORDER — TETRACAINE HCL 0.5 % OP SOLN
2.0000 [drp] | Freq: Once | OPHTHALMIC | Status: AC
  Administered 2017-02-24: 2 [drp] via OPHTHALMIC
  Filled 2017-02-24: qty 4

## 2017-02-24 NOTE — ED Notes (Signed)
[facility]- Right Eye- 20/16 Left Eye- 10/8 Both Eyes- 20/10

## 2017-02-24 NOTE — ED Triage Notes (Signed)
Pt reports several days of left eye irritation, drainage and redness. Exposed to pink eye.

## 2017-02-24 NOTE — ED Provider Notes (Signed)
[facility] EMERGENCY DEPARTMENT Provider Note   CSN: [id number] Arrival date & time: 02/24/17  1019     History   Chief Complaint Chief Complaint  Patient presents with  . Eye Problem    HPI Dawn Walsh is a 42 y.o. female with a hx of tobacco abuse who presents to the ED with left eye irritation that has been progressively worsening x 3 days. States she has had left eye pruritus, purulent drainage, eye redness, eye lid swelling, and eye pain. States that she has tried saline eye drops without relief. Pain somewhat worse with light, no other specific alleviating/aggravating factors. Patient is not a contact lens wearer. Denies change in vision, fever, congestion, rhinorrhea, or ear pain. Patient states recent sick contact with niece whom she lives with, she was diagnosed with bacterial conjunctivitis.   HPI  History reviewed. No pertinent past medical history.  There are no active problems to display for this patient.   History reviewed. No pertinent surgical history.  OB History    No data available      Home Medications    Prior to Admission medications   Medication Sig Start Date End Date Taking? Authorizing Provider  aspirin 325 MG tablet Take 650 mg by mouth daily.    [provider]  ibuprofen (ADVIL,MOTRIN) 200 MG tablet Take 400 mg by mouth every 6 (six) hours as needed for pain.    [provider]  oxyCODONE-acetaminophen (PERCOCET/ROXICET) 5-325 MG tablet Take 2 tablets by mouth every 4 (four) hours as needed for severe pain. 03/09/15   Dowless, Lester Kinsman, PA-C    Family History History reviewed. No pertinent family history.  Social History Social History   Tobacco Use  . Smoking status: Current Every Day Smoker    Types: Cigarettes, Cigars  Substance Use Topics  . Alcohol use: No  . Drug use: Yes    Types: Marijuana     Allergies   Morphine and related   Review of Systems Review of Systems    Constitutional: Negative for chills and fever.  HENT: Negative for congestion, ear pain, rhinorrhea and sore throat.   Eyes: Positive for photophobia (mild ), pain, discharge, redness and itching. Negative for visual disturbance.     Physical Exam Updated Vital Signs BP (!) 118/91 (BP Location: Right Arm)   Pulse 97   Temp 98.6 F (37 C) (Oral)   Resp 18   LMP 02/12/2017   SpO2 100%   Physical Exam  Constitutional: She appears well-developed and well-nourished.  Non-toxic appearance. No distress.  HENT:  Head: Normocephalic and atraumatic.  Right Ear: Tympanic membrane normal.  Left Ear: Tympanic membrane normal.  Nose: Nose normal.  Mouth/Throat: Uvula is midline and oropharynx is clear and moist.  Eyes: EOM and lids are normal. Pupils are equal, round, and reactive to light. Lids are everted and swept, no foreign bodies found. Right eye exhibits no discharge. Left eye exhibits no discharge. Right conjunctiva is not injected. Right conjunctiva has no hemorrhage. Left conjunctiva is injected (very minimal). Left conjunctiva has no hemorrhage.  No proptosis or periorbital swelling.  No entrapment. No consensual photophobia. [place] exam: No fluoresceine uptake or dendritic morning. No evidence of corneal abrasion, ulcer, or HSV. There is no hyphema or hypopyon.   Lymphadenopathy:    She has no cervical adenopathy.  Neurological: She is alert.  Clear speech.   Psychiatric: She has a normal mood and affect. Her behavior is normal. Thought content  normal.  Nursing note and vitals reviewed.  ED Treatments / Results  Labs (all labs ordered are listed, but only abnormal results are displayed) Labs Reviewed - No data to display  EKG  EKG Interpretation None      Radiology No results found.  Procedures Procedures (including critical care time)  Medications Ordered in ED Medications  fluorescein ophthalmic strip 1 strip (not administered)  tetracaine (PONTOCAINE) 0.5  % ophthalmic solution 2 drop (not administered)    Initial Impression / Assessment and Plan / ED Course  I have reviewed the triage vital signs and the nursing notes.  Pertinent labs & imaging results that were available during my care of the patient were reviewed by me and considered in my medical decision making (see chart for details).  Patient presents with hx consistent with bacterial conjunctivitis. Patient is nontoxic appearing and in no apparent distress. On exam there is mild conjunctival injection of the left eye, there is no purulent drainage, however patient reports this in the AMs. PERRL, EOMI. There is no fluorescein uptake on exam, no indication of corneal abrasion/ulceration or HSV. Patient is afebrile and without proptosis, entrapment, or consensual photophobia, no periorbital swelling- doubt periorbital or orbital cellulitis. No significant visual acuity deficit. Patient is not a contact lens wearer. Will discharge home with prescription for Erythromycin ophthalmic ointment and ibuprofen.  Personal hygiene and frequent handwashing discussed.  I discussed treatment plan, ophthalmology follow-up, and return precautions with the patient. Provided opportunity for questions, patient confirmed understanding and is in agreement with plan.   Final Clinical Impressions(s) / ED Diagnoses   Final diagnoses:  Acute bacterial conjunctivitis of left eye    ED Discharge Orders        Ordered    erythromycin ophthalmic ointment     02/24/17 1219    ibuprofen (ADVIL,MOTRIN) 800 MG tablet  Every 8 hours PRN     02/24/17 1219       Cherly Andersonetrucelli, Kahle Mcqueen R, PA-C 02/24/17 1233    Terrilee FilesButler, Michael C, MD 02/25/17 [phone]

## 2017-02-24 NOTE — Discharge Instructions (Signed)
You were seen in the emergency department and diagnosed with bacterial conjunctivitis- please see attached handout for further information.   You were given a prescription for erythromycin, this is an antibiotic ointment to apply to your eye 5 times per day for the next 7 days.  Be sure to complete the course of the antibiotic.  Additionally you were prescribed ibuprofen 800 mg, you may take this once every 8 hours as needed for discomfort, you may supplement with Tylenol.  Follow-up with the ophthalmologist, eye doctor specialist in your discharge instructions in 3 days. Return to the emergency department for any new or worsening symptoms including but not limited to fever, difficulty with eye movement, swelling, change in your vision or any other concerns you may have.

## 2018-07-02 ENCOUNTER — Other Ambulatory Visit: Payer: Self-pay | Admitting: *Deleted

## 2018-07-02 DIAGNOSIS — Z20822 Contact with and (suspected) exposure to covid-19: Secondary | ICD-10-CM

## 2018-07-02 NOTE — Progress Notes (Signed)
la 

## 2018-07-12 NOTE — Addendum Note (Signed)
Addended by: Ralf Konopka M on: 07/12/2018 11:22 AM   Modules accepted: Orders  

## 2018-07-16 ENCOUNTER — Other Ambulatory Visit: Payer: Self-pay | Admitting: *Deleted

## 2018-07-16 DIAGNOSIS — Z20822 Contact with and (suspected) exposure to covid-19: Secondary | ICD-10-CM

## 2018-07-18 NOTE — Addendum Note (Signed)
Addended by: Brigitte Pulse on: 07/18/2018 09:18 PM   Modules accepted: Orders

## 2020-05-08 ENCOUNTER — Other Ambulatory Visit: Payer: Self-pay

## 2020-05-08 ENCOUNTER — Ambulatory Visit (HOSPITAL_COMMUNITY)
Admission: EM | Admit: 2020-05-08 | Discharge: 2020-05-08 | Disposition: A | Discharge status: Home / Self Care | Payer: Self-pay | Attending: Urgent Care | Admitting: Urgent Care

## 2020-05-08 ENCOUNTER — Encounter (HOSPITAL_COMMUNITY): Payer: Self-pay | Admitting: Emergency Medicine

## 2020-05-08 DIAGNOSIS — R112 Nausea with vomiting, unspecified: Secondary | ICD-10-CM

## 2020-05-08 DIAGNOSIS — K529 Noninfective gastroenteritis and colitis, unspecified: Secondary | ICD-10-CM

## 2020-05-08 DIAGNOSIS — R197 Diarrhea, unspecified: Secondary | ICD-10-CM

## 2020-05-08 MED ORDER — LOPERAMIDE HCL 2 MG PO CAPS: 2.0000 mg | ORAL_CAPSULE | Freq: Two times a day (BID) | ORAL | 0 refills | Status: DC | PRN

## 2020-05-08 MED ORDER — ONDANSETRON 8 MG PO TBDP: 8.0000 mg | ORAL_TABLET | Freq: Three times a day (TID) | ORAL | 0 refills | Status: DC | PRN

## 2020-05-08 NOTE — ED Triage Notes (Signed)
Pt is present today with abdominal pain, x6 vomiting, and diarrhea. Pt states that her sx started three weeks ago but last night was when patient states that the pain was unbearable

## 2020-05-08 NOTE — ED Provider Notes (Signed)
[facility]   MRN: [id number] DOB: April 02, 1975  Subjective:   Dawn Walsh is a 45 y.o. female presenting for 3-week history of persistent nausea, vomiting, abdominal pain and diarrhea.  States that initially her symptoms were mild but in this past week she has had more persistent vomiting diarrhea.  Patient states she has been trying to eat normal foods including takeout and a lot of processed foods.  Does not get much fiber in her diet.  Does not hydrate well.  Drinks sodas as well.  Reports that symptoms did start after 1 night that she ate out last weekend.  No current facility-administered medications for this encounter.  Current Outpatient Medications:  .  aspirin 325 MG tablet, Take 650 mg by mouth daily., Disp: , Rfl:  .  erythromycin ophthalmic ointment, Place a 1/2 inch ribbon of ointment into the lower eyelid 5 times per day for the next 7 days., Disp: 1 g, Rfl: 0 .  ibuprofen (ADVIL,MOTRIN) 800 MG tablet, Take 1 tablet (800 mg total) by mouth every 8 (eight) hours as needed., Disp: 21 tablet, Rfl: 0 .  oxyCODONE-acetaminophen (PERCOCET/ROXICET) 5-325 MG tablet, Take 2 tablets by mouth every 4 (four) hours as needed for severe pain., Disp: 6 tablet, Rfl: 0   Allergies  Allergen Reactions  . Morphine And Related Itching    History reviewed. No pertinent past medical history.   History reviewed. No pertinent surgical history.  History reviewed. No pertinent family history.  Social History   Tobacco Use  . Smoking status: Current Every Day Smoker    Types: Cigarettes, Cigars  Substance Use Topics  . Alcohol use: No  . Drug use: Yes    Types: Marijuana    ROS   Objective:   Vitals: BP 118/65 (BP Location: Left Arm)   Pulse (!) 109   Temp 98.2 F (36.8 C) (Oral)   Resp 18   SpO2 99%   Physical Exam Constitutional:      General: She is not in acute distress.    Appearance: Normal appearance. She is well-developed and normal weight.  She is not ill-appearing, toxic-appearing or diaphoretic.  HENT:     Head: Normocephalic and atraumatic.     Right Ear: External ear normal.     Left Ear: External ear normal.     Nose: Nose normal.     Mouth/Throat:     Mouth: Mucous membranes are moist.     Pharynx: Oropharynx is clear.  Eyes:     General: No scleral icterus.    Extraocular Movements: Extraocular movements intact.     Pupils: Pupils are equal, round, and reactive to light.  Cardiovascular:     Rate and Rhythm: Normal rate and regular rhythm.     Pulses: Normal pulses.     Heart sounds: Normal heart sounds. No murmur heard. No friction rub. No gallop.   Pulmonary:     Effort: Pulmonary effort is normal. No respiratory distress.     Breath sounds: Normal breath sounds. No stridor. No wheezing, rhonchi or rales.  Abdominal:     General: Bowel sounds are increased. There is no distension.     Palpations: Abdomen is soft. There is no mass.     Tenderness: There is no abdominal tenderness. There is no right CVA tenderness, left CVA tenderness, guarding or rebound.  Skin:    General: Skin is warm and dry.     Coloration: Skin is not pale.  Findings: No rash.  Neurological:     General: No focal deficit present.     Mental Status: She is alert and oriented to person, place, and time.  Psychiatric:        Mood and Affect: Mood normal.        Behavior: Behavior normal.        Thought Content: Thought content normal.        Judgment: Judgment normal.     Assessment and Plan :   PDMP not reviewed this encounter.  1. Gastroenteritis   2. Nausea vomiting and diarrhea     Will manage for suspected viral gastroenteritis with supportive care.  Recommended patient hydrate well, eat light meals and maintain electrolytes.  Will use Zofran and Imodium for nausea, vomiting and diarrhea.  Discussed possibility of poor diet choices as reason for her current symptoms.  Discussed dietary modifications going forward.   Counseled patient on potential for adverse effects with medications prescribed/recommended today, ER and return-to-clinic precautions discussed, patient verbalized understanding.    Wallis Bamberg, [place] 05/08/20 [id number]

## 2020-05-08 NOTE — Discharge Instructions (Addendum)

## 2021-03-07 ENCOUNTER — Telehealth: Payer: Self-pay | Admitting: Physician Assistant

## 2021-03-07 DIAGNOSIS — K2990 Gastroduodenitis, unspecified, without bleeding: Secondary | ICD-10-CM

## 2021-03-07 MED ORDER — ONDANSETRON 4 MG PO TBDP: 4.0000 mg | ORAL_TABLET | Freq: Three times a day (TID) | ORAL | 0 refills | Status: DC | PRN

## 2021-03-07 MED ORDER — SUCRALFATE 1 G PO TABS: 1.0000 g | ORAL_TABLET | Freq: Three times a day (TID) | ORAL | 0 refills | Status: DC

## 2021-03-07 MED ORDER — PANTOPRAZOLE SODIUM 40 MG PO TBEC: 40.0000 mg | DELAYED_RELEASE_TABLET | Freq: Every day | ORAL | 0 refills | Status: DC

## 2021-03-07 NOTE — Patient Instructions (Signed)
Dawn Walsh, thank you for joining Leeanne Rio, PA-C for today's virtual visit.  While this provider is not your primary care provider (PCP), if your PCP is located in our provider database this encounter information will be shared with them immediately following your visit.  Consent: (Patient) Rickelle E Rickman provided verbal consent for this virtual visit at the beginning of the encounter.  Current Medications:  Current Outpatient Medications:    aspirin 325 MG tablet, Take 650 mg by mouth daily., Disp: , Rfl:    erythromycin ophthalmic ointment, Place a 1/2 inch ribbon of ointment into the lower eyelid 5 times per day for the next 7 days., Disp: 1 g, Rfl: 0   ibuprofen (ADVIL,MOTRIN) 800 MG tablet, Take 1 tablet (800 mg total) by mouth every 8 (eight) hours as needed., Disp: 21 tablet, Rfl: 0   loperamide (IMODIUM) 2 MG capsule, Take 1 capsule (2 mg total) by mouth 2 (two) times daily as needed for diarrhea or loose stools., Disp: 14 capsule, Rfl: 0   ondansetron (ZOFRAN-ODT) 8 MG disintegrating tablet, Take 1 tablet (8 mg total) by mouth every 8 (eight) hours as needed for nausea or vomiting., Disp: 20 tablet, Rfl: 0   oxyCODONE-acetaminophen (PERCOCET/ROXICET) 5-325 MG tablet, Take 2 tablets by mouth every 4 (four) hours as needed for severe pain., Disp: 6 tablet, Rfl: 0   Medications ordered in this encounter:  No orders of the defined types were placed in this encounter.    *If you need refills on other medications prior to your next appointment, please contact your pharmacy*  Follow-Up: Call back or seek an in-person evaluation if the symptoms worsen or if the condition fails to improve as anticipated.  Other Instructions Please keep well-hydrated and get plenty of rest. Avoid coffee, sodas and juices. WATER IS BEST. Avoid alcohol and NSAIDs (Advil, Aleve, Goody Powders) or Aspirin. You can use Tylenol for now if needing anything for pain. Start a daily  probiotic as discussed. Start the GERD diet (see below). Take the Carafate as directed for the next week or so. Start the Protonix and take daily until your in-person follow-up.  Use the links below to get scheduled with a primary care provider. If you are unable to be evaluated within the next 2-3 weeks by a PCP, then you need a follow-up at an in-person urgent care.   If anything worsens despite treatment, you need an ER evaluation. Please do not delay care!  Food Choices for Gastroesophageal Reflux Disease, Adult When you have gastroesophageal reflux disease (GERD), the foods you eat and your eating habits are very important. Choosing the right foods can help ease your discomfort. Think about working with a food expert (dietitian) to help you make good choices. What are tips for following this plan? Reading food labels Look for foods that are low in saturated fat. Foods that may help with your symptoms include: Foods that have less than 5% of daily value (DV) of fat. Foods that have 0 grams of trans fat. Cooking Do not fry your food. Cook your food by baking, steaming, grilling, or broiling. These are all methods that do not need a lot of fat for cooking. To add flavor, try to use herbs that are low in spice and acidity. Meal planning  Choose healthy foods that are low in fat, such as: Fruits and vegetables. Whole grains. Low-fat dairy products. Lean meats, fish, and poultry. Eat small meals often instead of eating 3 large meals each day.  Eat your meals slowly in a place where you are relaxed. Avoid bending over or lying down until 2-3 hours after eating. Limit high-fat foods such as fatty meats or fried foods. Limit your intake of fatty foods, such as oils, butter, and shortening. Avoid the following as told by your doctor: Foods that cause symptoms. These may be different for different people. Keep a food diary to keep track of foods that cause symptoms. Alcohol. Drinking a lot  of liquid with meals. Eating meals during the 2-3 hours before bed. Lifestyle Stay at a healthy weight. Ask your doctor what weight is healthy for you. If you need to lose weight, work with your doctor to do so safely. Exercise for at least 30 minutes on 5 or more days each week, or as told by your doctor. Wear loose-fitting clothes. Do not smoke or use any products that contain nicotine or tobacco. If you need help quitting, ask your doctor. Sleep with the head of your bed higher than your feet. Use a wedge under the mattress or blocks under the bed frame to raise the head of the bed. Chew sugar-free gum after meals. What foods should eat? Eat a healthy, well-balanced diet of fruits, vegetables, whole grains, low-fat dairy products, lean meats, fish, and poultry. Each person is different. Foods that may cause symptoms in one person may not cause any symptoms in another person. Work with your doctor to find foods that are safe for you. The items listed above may not be a complete list of what you can eat and drink. Contact a food expert for more options. What foods should I avoid? Limiting some of these foods may help in managing the symptoms of GERD. Everyone is different. Talk with a food expert or your doctor to help you find the exact foods to avoid, if any. Fruits Any fruits prepared with added fat. Any fruits that cause symptoms. For some people, this may include citrus fruits, such as oranges, grapefruit, pineapple, and lemons. Vegetables Deep-fried vegetables. [place] fries. Any vegetables prepared with added fat. Any vegetables that cause symptoms. For some people, this may include tomatoes and tomato products, chili peppers, onions and garlic, and horseradish. Grains Pastries or quick breads with added fat. Meats and other proteins High-fat meats, such as fatty beef or pork, hot dogs, ribs, ham, sausage, salami, and bacon. Fried meat or protein, including fried fish and fried chicken.  Nuts and nut butters, in large amounts. Dairy Whole milk and chocolate milk. Sour cream. Cream. Ice cream. Cream cheese. Milkshakes. Fats and oils Butter. Margarine. Shortening. Ghee. Beverages Coffee and tea, with or without caffeine. Carbonated beverages. Sodas. Energy drinks. Fruit juice made with acidic fruits, such as orange or grapefruit. Tomato juice. Alcoholic drinks. Sweets and desserts Chocolate and cocoa. Donuts. Seasonings and condiments Pepper. Peppermint and spearmint. Added salt. Any condiments, herbs, or seasonings that cause symptoms. For some people, this may include curry, hot sauce, or vinegar-based salad dressings. The items listed above may not be a complete list of what you should not eat and drink. Contact a food expert for more options. Questions to ask your doctor Diet and lifestyle changes are often the first steps that are taken to manage symptoms of GERD. If diet and lifestyle changes do not help, talk with your doctor about taking medicines. Where to find more information International Foundation for Gastrointestinal Disorders: aboutgerd.org Summary When you have GERD, food and lifestyle choices are very important in easing your symptoms. Eat small meals  often instead of 3 large meals a day. Eat your meals slowly and in a place where you are relaxed. Avoid bending over or lying down until 2-3 hours after eating. Limit high-fat foods such as fatty meats or fried foods. This information is not intended to replace advice given to you by your health care provider. Make sure you discuss any questions you have with your health care provider. Document Revised: 07/22/2019 Document Reviewed: 07/22/2019 Elsevier Patient Education  2022 [employer].    If you have been instructed to have an in-person evaluation today at a local Urgent Care facility, please use the link below. It will take you to a list of all of our available [facility], including  address, phone number and hours of operation. Please do not delay care.  [facility]  If you or a family member do not have a primary care provider, use the link below to schedule a visit and establish care. When you choose a [facility] primary care physician or advanced practice provider, you gain a long-term partner in health. Find a Primary Care Provider  Learn more about [facility]'s in-office and virtual care options: [facility]

## 2021-03-07 NOTE — Addendum Note (Signed)
Addended by: Waldon Merl on: 03/07/2021 05:12 PM   Modules accepted: Orders

## 2021-03-07 NOTE — Progress Notes (Signed)
The patient no-showed for appointment despite this provider sending direct link, reaching out via phone with no response and waiting for at least 10 minutes from appointment time for patient to join. They will be marked as a NS for this appointment/time.  ? ?Lafern Brinkley Cody Karlisha Mathena, PA-C ? ? ? ?

## 2021-03-07 NOTE — Progress Notes (Signed)
Virtual Visit Consent   Dawn Walsh, you are scheduled for a virtual visit with a [facility] provider today.     Just as with appointments in the office, your consent must be obtained to participate.  Your consent will be active for this visit and any virtual visit you may have with one of our providers in the next 365 days.     If you have a MyChart account, a copy of this consent can be sent to you electronically.  All virtual visits are billed to your insurance company just like a traditional visit in the office.    As this is a virtual visit, video technology does not allow for your provider to perform a traditional examination.  This may limit your provider's ability to fully assess your condition.  If your provider identifies any concerns that need to be evaluated in person or the need to arrange testing (such as labs, EKG, etc.), we will make arrangements to do so.     Although advances in technology are sophisticated, we cannot ensure that it will always work on either your end or our end.  If the connection with a video visit is poor, the visit may have to be switched to a telephone visit.  With either a video or telephone visit, we are not always able to ensure that we have a secure connection.     I need to obtain your verbal consent now.   Are you willing to proceed with your visit today?    Chanler E Rineer has provided verbal consent on 03/07/2021 for a virtual visit (video or telephone).   Dawn Walsh, [place]   Date: 03/07/2021 5:04 PM   Virtual Visit via Video Note   I, Dawn Walsh, connected with  Paquita E Damaso  ([id number], [birth year]) on 03/07/21 at  4:45 PM EST by a video-enabled telemedicine application and verified that I am speaking with the correct person using two identifiers.  Location: Patient: Virtual Visit Location Patient: Home Provider: Virtual Visit Location Provider: Home Office   I discussed the limitations of evaluation and  management by telemedicine and the availability of in person appointments. The patient expressed understanding and agreed to proceed.    History of Present Illness: Dawn Walsh is a 46 y.o. who identifies as a female who was assigned female at birth, and is being seen today for about 1 month of LUQ pain after eating (dinnertime only), heartburn and bloating with some nausea (throughout the day but worse with meals). Notes has had a couple of episodes of non-bloody emesis. She will eat breakfast and sometimes lunch but now skips dinner completely due to fear of worsening symptoms. She is staying hydrating. Notes she was seen for this at an outside urgent care when symptoms started. Was told not to drink sodas so she has switched to water but mainly juice and coffee. For breakfast she will have a sausage biscuit as she works at [employer]. When she eats lunch it will be something like a slice of pizza and breadsticks. Denies NSAID use or significant alcohol consumption. Notes with her episodes of pain she will sometimes have loose stool afterwards. Denies melena, hematochezia or tenesmus. Has lost weight due to decreased intake. Sometimes takes TUMS which helps. Has taken OTC GasX without much improvement.  Is currently without PCP as she was without insurance. Has [facility] now and trying to get set up with a PCP.  HPI: HPI  Problems: There are  no problems to display for this patient.   Allergies:  Allergies  Allergen Reactions   Morphine And Related Itching   Medications: No current outpatient medications on file.  Observations/Objective: Patient is well-developed, well-nourished in no acute distress.  Resting comfortably at home.  Head is normocephalic, atraumatic.  No labored breathing. Speech is clear and coherent with logical content.  Patient is alert and oriented at baseline.   Assessment and Plan: 1. Gastritis and duodenitis  With concern for PUD. No signs or symptoms to  indicate bleeding but these were reviewed in detail with patient so she can monitor. Start GERD diet -- removing coffee and juices and replacing with water as well. Will start Pantoprazole 40 mg daily for at least 14 days -- will give 1 month supply but she is to follow-up with UC in-person if unable to see a PCP within 14 days. Carafate TID and bedtime over the next 7-10 days. Will put Zofran on file as well. Links given to help her get scheduled with PCP for monitoring and further labs including CBC, CMP, H pylori testing. Again ER precautions reviewed.   Follow Up Instructions: I discussed the assessment and treatment plan with the patient. The patient was provided an opportunity to ask questions and all were answered. The patient agreed with the plan and demonstrated an understanding of the instructions.  A copy of instructions were sent to the patient via MyChart unless otherwise noted below.   The patient was advised to call back or seek an in-person evaluation if the symptoms worsen or if the condition fails to improve as anticipated.  Time:  I spent 24 minutes with the patient via telehealth technology discussing the above problems/concerns.    Dawn Climes, PA-C

## 2021-04-14 ENCOUNTER — Other Ambulatory Visit: Payer: Self-pay

## 2021-04-14 ENCOUNTER — Other Ambulatory Visit: Payer: Self-pay | Admitting: Family Medicine

## 2021-04-14 ENCOUNTER — Ambulatory Visit (INDEPENDENT_AMBULATORY_CARE_PROVIDER_SITE_OTHER): Payer: 59 | Admitting: Family Medicine

## 2021-04-14 ENCOUNTER — Encounter: Payer: Self-pay | Admitting: Family Medicine

## 2021-04-14 VITALS — BP 151/89 | HR 94 | Temp 98.0°F | Resp 16 | Ht 67.0 in | Wt 126.8 lb

## 2021-04-14 DIAGNOSIS — I10 Essential (primary) hypertension: Secondary | ICD-10-CM | POA: Diagnosis not present

## 2021-04-14 DIAGNOSIS — R634 Abnormal weight loss: Secondary | ICD-10-CM | POA: Diagnosis not present

## 2021-04-14 DIAGNOSIS — F17290 Nicotine dependence, other tobacco product, uncomplicated: Secondary | ICD-10-CM

## 2021-04-14 DIAGNOSIS — K2990 Gastroduodenitis, unspecified, without bleeding: Secondary | ICD-10-CM

## 2021-04-14 DIAGNOSIS — Z7689 Persons encountering health services in other specified circumstances: Secondary | ICD-10-CM

## 2021-04-14 DIAGNOSIS — F17200 Nicotine dependence, unspecified, uncomplicated: Secondary | ICD-10-CM

## 2021-04-14 MED ORDER — ONDANSETRON 4 MG PO TBDP: 4.0000 mg | ORAL_TABLET | Freq: Three times a day (TID) | ORAL | 0 refills | Status: DC | PRN

## 2021-04-14 MED ORDER — PANTOPRAZOLE SODIUM 40 MG PO TBEC: 40.0000 mg | DELAYED_RELEASE_TABLET | Freq: Every day | ORAL | 0 refills | Status: DC

## 2021-04-14 MED ORDER — SUCRALFATE 1 G PO TABS: 1.0000 g | ORAL_TABLET | Freq: Three times a day (TID) | ORAL | 0 refills | Status: DC

## 2021-04-14 MED ORDER — LOSARTAN POTASSIUM 50 MG PO TABS: 50.0000 mg | ORAL_TABLET | Freq: Every day | ORAL | 0 refills | Status: DC

## 2021-04-14 NOTE — Progress Notes (Signed)
Patient is concern about why she may be losing weight. Patient has seen a provider and was given ABX that helps a little  ? ?Patient had 3 family deaths within the last 3 years ?

## 2021-04-14 NOTE — Progress Notes (Signed)
? ?New Patient Office Visit ? ?Subjective:  ?Patient ID: Dawn Walsh, female    DOB: [birth year]  Age: 46 y.o. MRN: [id number] ? ?CC:  ?Chief Complaint  ?Patient presents with  ? Establish Care  ? ? ?HPI ?Dawn Walsh presents for establish care. Patient also complaint os weight loss but is unable to relate how much and in what period of time. She also would like refills of meds for her stomach.  ? ?History reviewed. No pertinent past medical history. ? ?Past Surgical History:  ?Procedure Laterality Date  ? TUBAL LIGATION  04/09/2000  ? ? ?History reviewed. No pertinent family history. ? ?Social History  ? ?Socioeconomic History  ? Marital status: Single  ?  Spouse name: Not on file  ? Number of children: Not on file  ? Years of education: Not on file  ? Highest education level: Not on file  ?Occupational History  ? Not on file  ?Tobacco Use  ? Smoking status: Some Days  ?  Types: Cigars  ? Smokeless tobacco: Not on file  ?Substance and Sexual Activity  ? Alcohol use: No  ? Drug use: Yes  ?  Frequency: 6.0 times per week  ?  Types: Marijuana  ? Sexual activity: Yes  ?Other Topics Concern  ? Not on file  ?Social History Narrative  ? Not on file  ? ?Social Determinants of Health  ? ?Financial Resource Strain: Not on file  ?Food Insecurity: Not on file  ?Transportation Needs: Not on file  ?Physical Activity: Not on file  ?Stress: Not on file  ?Social Connections: Not on file  ?Intimate Partner Violence: Not on file  ? ? ?ROS ?Review of Systems  ?Constitutional:  Positive for unexpected weight change. Negative for activity change and appetite change.  ?Gastrointestinal:  Positive for abdominal distention and nausea. Negative for blood in stool and diarrhea.  ?Neurological:  Positive for headaches.  ?All other systems reviewed and are negative. ? ?Objective:  ? ?Today's Vitals: BP (!) 151/89   Pulse 94   Temp 98 ?F (36.7 ?C) (Oral)   Resp 16   Ht 5\' 7"  (1.702 m)   Wt 126 lb 12.8 oz (57.5 kg)   SpO2 100%    BMI 19.86 kg/m?  ? ?Physical Exam ?Vitals and nursing note reviewed.  ?Constitutional:   ?   General: She is not in acute distress. ?Cardiovascular:  ?   Rate and Rhythm: Normal rate and regular rhythm.  ?Pulmonary:  ?   Effort: Pulmonary effort is normal.  ?   Breath sounds: Normal breath sounds.  ?Abdominal:  ?   Palpations: Abdomen is soft.  ?   Tenderness: There is no abdominal tenderness.  ?Musculoskeletal:  ?   Right lower leg: No edema.  ?   Left lower leg: No edema.  ?Neurological:  ?   General: No focal deficit present.  ?   Mental Status: She is alert and oriented to person, place, and time.  ? ? ?Assessment & Plan:  ? ?1. Essential hypertension ?Elevated readings. Losartan 50 mg daily prescribed. monitor ?- losartan (COZAAR) 50 MG tablet; Take 1 tablet (50 mg total) by mouth daily.  Dispense: 90 tablet; Refill: 0 ? ?2. Tobacco use disorder ?Discussed reduction/cessation ? ?3. Weight loss, unintentional ?There is no record of weight  and patient unable to relay how much weight loss over what period of time.continue daily ensure. monitor ? ?4. Gastritis and duodenitis ?Discussed dietary options. Continue and monitor. Meds refilled ? ?-  pantoprazole (PROTONIX) 40 MG tablet; Take 1 tablet (40 mg total) by mouth daily.  Dispense: 30 tablet; Refill: 0 ?- sucralfate (CARAFATE) 1 g tablet; Take 1 tablet (1 g total) by mouth 4 (four) times daily -  with meals and at bedtime.  Dispense: 30 tablet; Refill: 0 ?- ondansetron (ZOFRAN-ODT) 4 MG disintegrating tablet; Take 1 tablet (4 mg total) by mouth every 8 (eight) hours as needed for nausea or vomiting.  Dispense: 20 tablet; Refill: 0 ? ?5. Encounter to establish care ? ? ?Outpatient Encounter Medications as of 04/14/2021  ?Medication Sig  ? losartan (COZAAR) 50 MG tablet Take 1 tablet (50 mg total) by mouth daily.  ? [DISCONTINUED] ondansetron (ZOFRAN-ODT) 4 MG disintegrating tablet Take 1 tablet (4 mg total) by mouth every 8 (eight) hours as needed for nausea or  vomiting.  ? ondansetron (ZOFRAN-ODT) 4 MG disintegrating tablet Take 1 tablet (4 mg total) by mouth every 8 (eight) hours as needed for nausea or vomiting.  ? pantoprazole (PROTONIX) 40 MG tablet Take 1 tablet (40 mg total) by mouth daily.  ? sucralfate (CARAFATE) 1 g tablet Take 1 tablet (1 g total) by mouth 4 (four) times daily -  with meals and at bedtime.  ? [DISCONTINUED] pantoprazole (PROTONIX) 40 MG tablet Take 1 tablet (40 mg total) by mouth daily. (Patient not taking: Reported on 04/14/2021)  ? [DISCONTINUED] sucralfate (CARAFATE) 1 g tablet Take 1 tablet (1 g total) by mouth 4 (four) times daily -  with meals and at bedtime. (Patient not taking: Reported on 04/14/2021)  ? ?No facility-administered encounter medications on file as of 04/14/2021.  ? ? ?Follow-up: Return in about 4 weeks (around 05/12/2021) for physical.  ? ?Tommie Raymond, MD ? ?

## 2021-05-09 ENCOUNTER — Other Ambulatory Visit: Payer: Self-pay | Admitting: Family Medicine

## 2021-05-09 DIAGNOSIS — K2990 Gastroduodenitis, unspecified, without bleeding: Secondary | ICD-10-CM

## 2021-05-19 ENCOUNTER — Other Ambulatory Visit: Payer: Self-pay

## 2021-05-19 ENCOUNTER — Ambulatory Visit (INDEPENDENT_AMBULATORY_CARE_PROVIDER_SITE_OTHER): Payer: 59 | Admitting: Family Medicine

## 2021-05-19 ENCOUNTER — Other Ambulatory Visit: Payer: Self-pay | Admitting: Family Medicine

## 2021-05-19 ENCOUNTER — Encounter: Payer: Self-pay | Admitting: Family Medicine

## 2021-05-19 VITALS — BP 127/81 | HR 104 | Temp 98.1°F | Resp 16 | Ht 66.0 in | Wt 121.6 lb

## 2021-05-19 DIAGNOSIS — R634 Abnormal weight loss: Secondary | ICD-10-CM | POA: Diagnosis not present

## 2021-05-19 DIAGNOSIS — K2990 Gastroduodenitis, unspecified, without bleeding: Secondary | ICD-10-CM

## 2021-05-19 DIAGNOSIS — R197 Diarrhea, unspecified: Secondary | ICD-10-CM

## 2021-05-19 MED ORDER — SUCRALFATE 1 G PO TABS: 1.0000 g | ORAL_TABLET | Freq: Three times a day (TID) | ORAL | 3 refills | Status: DC

## 2021-05-20 ENCOUNTER — Encounter: Payer: Self-pay | Admitting: Nurse Practitioner

## 2021-05-20 ENCOUNTER — Encounter: Payer: Self-pay | Admitting: Family Medicine

## 2021-05-20 LAB — CBC WITH DIFFERENTIAL/PLATELET
Basophils Absolute: 0.1 10*3/uL (ref 0.0–0.2)
Basos: 1 %
EOS (ABSOLUTE): 0.1 10*3/uL (ref 0.0–0.4)
Eos: 2 %
Hematocrit: 27 % — ABNORMAL LOW (ref 34.0–46.6)
Hemoglobin: 7.7 g/dL — ABNORMAL LOW (ref 11.1–15.9)
Immature Grans (Abs): 0 10*3/uL (ref 0.0–0.1)
Immature Granulocytes: 0 %
Lymphocytes Absolute: 1.5 10*3/uL (ref 0.7–3.1)
Lymphs: 23 %
MCH: 20.3 pg — ABNORMAL LOW (ref 26.6–33.0)
MCHC: 28.5 g/dL — ABNORMAL LOW (ref 31.5–35.7)
MCV: 71 fL — ABNORMAL LOW (ref 79–97)
Monocytes Absolute: 0.6 10*3/uL (ref 0.1–0.9)
Monocytes: 9 %
Neutrophils Absolute: 4.3 10*3/uL (ref 1.4–7.0)
Neutrophils: 65 %
Platelets: 557 10*3/uL — ABNORMAL HIGH (ref 150–450)
RBC: 3.8 x10E6/uL (ref 3.77–5.28)
RDW: 16.2 % — ABNORMAL HIGH (ref 11.7–15.4)
WBC: 6.4 10*3/uL (ref 3.4–10.8)

## 2021-05-20 LAB — CMP14+EGFR
ALT: 8 IU/L (ref 0–32)
AST: 14 IU/L (ref 0–40)
Albumin/Globulin Ratio: 1.4 (ref 1.2–2.2)
Albumin: 3.5 g/dL — ABNORMAL LOW (ref 3.8–4.8)
Alkaline Phosphatase: 61 IU/L (ref 44–121)
BUN/Creatinine Ratio: 15 (ref 9–23)
BUN: 9 mg/dL (ref 6–24)
Bilirubin Total: <0.2 mg/dL (ref 0.0–1.2)
CO2: 24 mmol/L (ref 20–29)
Calcium: 8.8 mg/dL (ref 8.7–10.2)
Chloride: 108 mmol/L — ABNORMAL HIGH (ref 96–106)
Creatinine, Ser: 0.6 mg/dL (ref 0.57–1.00)
Globulin, Total: 2.5 g/dL (ref 1.5–4.5)
Glucose: 72 mg/dL (ref 70–99)
Potassium: 3.8 mmol/L (ref 3.5–5.2)
Sodium: 142 mmol/L (ref 134–144)
Total Protein: 6 g/dL (ref 6.0–8.5)
eGFR: 114 mL/min/{1.73_m2} (ref >59)

## 2021-05-20 LAB — TSH: TSH: 0.202 u[IU]/mL — ABNORMAL LOW (ref 0.450–4.500)

## 2021-05-20 LAB — HEMOGLOBIN A1C
Est. average glucose Bld gHb Est-mCnc: 103 mg/dL
Hgb A1c MFr Bld: 5.2 % (ref 4.8–5.6)

## 2021-05-20 NOTE — Progress Notes (Signed)
? ?Established Patient Office Visit ? ?Subjective   ? ?Patient ID: Dawn Walsh, female    DOB: [birth year]  Age: 46 y.o. MRN: [id number] ? ?CC:  ?Chief Complaint  ?Patient presents with  ? Annual Exam  ? ? ?HPI ?Dawn Walsh presents with complaint of diarrhea several times a day and weight loss. Patient also has intermittent left sided abdominal pain.  ? ? ?Outpatient Encounter Medications as of 05/19/2021  ?Medication Sig  ? losartan (COZAAR) 50 MG tablet Take 1 tablet (50 mg total) by mouth daily. (Patient taking differently: Take 50 mg by mouth daily.)  ? ondansetron (ZOFRAN-ODT) 4 MG disintegrating tablet Take 1 tablet (4 mg total) by mouth every 8 (eight) hours as needed for nausea or vomiting.  ? pantoprazole (PROTONIX) 40 MG tablet TAKE 1 TABLET(40 MG) BY MOUTH DAILY  ? [DISCONTINUED] sucralfate (CARAFATE) 1 g tablet Take 1 tablet (1 g total) by mouth 4 (four) times daily -  with meals and at bedtime.  ? ?No facility-administered encounter medications on file as of 05/19/2021.  ? ? ?No past medical history on file. ? ?Past Surgical History:  ?Procedure Laterality Date  ? TUBAL LIGATION  04/09/2000  ? ? ?No family history on file. ? ?Social History  ? ?Socioeconomic History  ? Marital status: Single  ?  Spouse name: Not on file  ? Number of children: Not on file  ? Years of education: Not on file  ? Highest education level: Not on file  ?Occupational History  ? Not on file  ?Tobacco Use  ? Smoking status: Some Days  ?  Types: Cigars  ? Smokeless tobacco: Not on file  ?Substance and Sexual Activity  ? Alcohol use: No  ? Drug use: Yes  ?  Frequency: 6.0 times per week  ?  Types: Marijuana  ? Sexual activity: Yes  ?Other Topics Concern  ? Not on file  ?Social History Narrative  ? Not on file  ? ?Social Determinants of Health  ? ?Financial Resource Strain: Not on file  ?Food Insecurity: Not on file  ?Transportation Needs: Not on file  ?Physical Activity: Not on file  ?Stress: Not on file  ?Social  Connections: Not on file  ?Intimate Partner Violence: Not on file  ? ? ?Review of Systems  ?Gastrointestinal:  Positive for abdominal pain and diarrhea. Negative for blood in stool, nausea and vomiting.  ?All other systems reviewed and are negative. ? ?  ? ? ?Objective   ? ?BP 127/81   Pulse (!) 104   Temp 98.1 ?F (36.7 ?C) (Oral)   Resp 16   Ht $R'5\' 6"'cp$  (1.676 m)   Wt 121 lb 9.6 oz (55.2 kg)   SpO2 98%   BMI 19.63 kg/m?  ? ?Physical Exam ?Vitals and nursing note reviewed.  ?Constitutional:   ?   General: She is not in acute distress. ?Cardiovascular:  ?   Rate and Rhythm: Normal rate and regular rhythm.  ?Pulmonary:  ?   Effort: Pulmonary effort is normal.  ?   Breath sounds: Normal breath sounds.  ?Abdominal:  ?   Palpations: Abdomen is soft.  ?   Tenderness: There is abdominal tenderness in the right lower quadrant.  ?Musculoskeletal:  ?   Right lower leg: No edema.  ?   Left lower leg: No edema.  ?Neurological:  ?   General: No focal deficit present.  ?   Mental Status: She is alert and oriented to person, place, and time.  ? ? ? ?  ? ?  Assessment & Plan:  ? ?1. Diarrhea, unspecified type ?Labs ordered. Referral to [facility] for further eval/mgt ?- CBC with Differential ?- CMP14+EGFR ?- Ambulatory referral to Gastroenterology ? ?2. Weight loss, unintentional ?As above ?- CMP14+EGFR ?- TSH ?- Hemoglobin A1c ?- Ambulatory referral to Gastroenterology ? ?3. Gastritis and duodenitis ?As above ?- CBC with Differential ?- Ambulatory referral to Gastroenterology ? ? ? ?No follow-ups on file.  ? ?Becky Sax, MD ? ? ?

## 2021-05-22 ENCOUNTER — Encounter: Payer: Self-pay | Admitting: Family Medicine

## 2021-05-22 LAB — TSH+T4F+T3FREE
Free T4: 1.25 ng/dL (ref 0.82–1.77)
T3, Free: 2.4 pg/mL (ref 2.0–4.4)
TSH: 0.219 u[IU]/mL — ABNORMAL LOW (ref 0.450–4.500)

## 2021-05-22 LAB — SPECIMEN STATUS REPORT

## 2021-05-24 ENCOUNTER — Telehealth: Payer: Self-pay

## 2021-05-24 NOTE — Telephone Encounter (Signed)
Pt given lab results per notes of Dr. Andrey Campanile on 05/24/21. Pt had questions as far as if she needed to follow up on thyroid and also wanted to see if she could get a refill on the sucralfate or if she needed to wait until she sees the GI dr on 06/04/21. I advised pt I would send to PCP and have someone f/up with her. Pt verbalized understanding.  ?

## 2021-05-24 NOTE — Telephone Encounter (Signed)
Patient was called and there was no answer. CRM nurse can result for patient if  call back ?

## 2021-05-25 ENCOUNTER — Other Ambulatory Visit: Payer: Self-pay | Admitting: Family Medicine

## 2021-05-26 NOTE — Telephone Encounter (Signed)
Patient was called and labs were explained  ?

## 2021-05-26 NOTE — Telephone Encounter (Signed)
Patient was called and labs were explained  ?

## 2021-06-04 ENCOUNTER — Ambulatory Visit: Payer: 59 | Admitting: Nurse Practitioner

## 2021-06-04 ENCOUNTER — Other Ambulatory Visit (INDEPENDENT_AMBULATORY_CARE_PROVIDER_SITE_OTHER): Payer: 59

## 2021-06-04 ENCOUNTER — Encounter: Payer: Self-pay | Admitting: Nurse Practitioner

## 2021-06-04 VITALS — BP 110/70 | HR 102 | Ht 67.5 in | Wt 122.0 lb

## 2021-06-04 DIAGNOSIS — D649 Anemia, unspecified: Secondary | ICD-10-CM

## 2021-06-04 DIAGNOSIS — R197 Diarrhea, unspecified: Secondary | ICD-10-CM

## 2021-06-04 DIAGNOSIS — R1032 Left lower quadrant pain: Secondary | ICD-10-CM | POA: Diagnosis not present

## 2021-06-04 DIAGNOSIS — R1012 Left upper quadrant pain: Secondary | ICD-10-CM | POA: Diagnosis not present

## 2021-06-04 DIAGNOSIS — R112 Nausea with vomiting, unspecified: Secondary | ICD-10-CM

## 2021-06-04 DIAGNOSIS — R634 Abnormal weight loss: Secondary | ICD-10-CM | POA: Diagnosis not present

## 2021-06-04 HISTORY — DX: Anemia, unspecified: D64.9

## 2021-06-04 LAB — CBC WITH DIFFERENTIAL/PLATELET
Basophils Absolute: 0.1 10*3/uL (ref 0.0–0.1)
Basophils Relative: 0.8 % (ref 0.0–3.0)
Eosinophils Absolute: 0.1 10*3/uL (ref 0.0–0.7)
Eosinophils Relative: 1.5 % (ref 0.0–5.0)
HCT: 26.5 % — ABNORMAL LOW (ref 36.0–46.0)
Hemoglobin: 8.1 g/dL — ABNORMAL LOW (ref 12.0–15.0)
Lymphocytes Relative: 20.9 % (ref 12.0–46.0)
Lymphs Abs: 1.4 10*3/uL (ref 0.7–4.0)
MCHC: 30.6 g/dL (ref 30.0–36.0)
MCV: 66.7 fl — ABNORMAL LOW (ref 78.0–100.0)
Monocytes Absolute: 0.4 10*3/uL (ref 0.1–1.0)
Monocytes Relative: 6.1 % (ref 3.0–12.0)
Neutro Abs: 4.7 10*3/uL (ref 1.4–7.7)
Neutrophils Relative %: 70.7 % (ref 43.0–77.0)
Platelets: 453 10*3/uL — ABNORMAL HIGH (ref 150.0–400.0)
RBC: 3.98 Mil/uL (ref 3.87–5.11)
RDW: 18 % — ABNORMAL HIGH (ref 11.5–15.5)
WBC: 6.7 10*3/uL (ref 4.0–10.5)

## 2021-06-04 LAB — IBC + FERRITIN
Ferritin: 2.8 ng/mL — ABNORMAL LOW (ref 10.0–291.0)
Iron: 10 ug/dL — ABNORMAL LOW (ref 42–145)
Saturation Ratios: 1.9 % — ABNORMAL LOW (ref 20.0–50.0)
TIBC: 532 ug/dL — ABNORMAL HIGH (ref 250.0–450.0)
Transferrin: 380 mg/dL — ABNORMAL HIGH (ref 212.0–360.0)

## 2021-06-04 LAB — LIPASE: Lipase: 30 U/L (ref 11.0–59.0)

## 2021-06-04 LAB — C-REACTIVE PROTEIN: CRP: <1 mg/dL (ref 0.5–20.0)

## 2021-06-04 LAB — B12 AND FOLATE PANEL
Folate: >23.5 ng/mL (ref >5.9)
Vitamin B-12: 582 pg/mL (ref 211–911)

## 2021-06-04 NOTE — Progress Notes (Signed)
? ? ? ?06/04/2021 ?Renessa E Resler ?[id number] ?[birth year] ? ? ?CHIEF COMPLAINT: Weight loss, N/V, diarrhea and left-sided abdominal pain ? ?HISTORY OF PRESENT ILLNESS: Dawn Walsh is a 46 year old female with a past medical history of hypertension S/P tubal ligation 03/2000. She presents to our office today as referred by Dr. Georganna SkeansAmelia Wilson for further evaluation regarding gastritis, diarrhea and weight loss. She was seen by Dr. Georganna SkeansAmelia Wilson 05/19/2021 and laboratory studies at that time identified significant anemia with a hemoglobin level 7.7.  Hematocrit 27.0.  TSH  0.219. T3 2.4. Free T4 1.25.  She was referred to our office for further GI evaluation.  She complains of having urea and left mid to lower abdominal pain with associated swelling for the past 5 to 6 months.  She endorses passing up to 7 episodes of mud to to watery diarrhea daily.  She recalled passing 2 bloody episodes of diarrhea approximately 5 months ago, no further bloody bowel movements since then.  No melena.  No antibiotic use within the past 6 months.  She took Loperamide 2 mg daily for approximately 3 weeks which resulted in worsening abdominal cramping so she stopped taking it.  She has nausea and started vomiting partially digested food 3 days weekly approximately 3 months ago.  She smokes marijuana daily for the past 2 to 3 years.  No alcohol use.  She smokes 2 to 3 cigars daily for the past 2 years.  No fever, sweats or chills.  She reported losing 60 pounds over the past 6 months.  She is attempting to drink 8 ounces of Ensure daily.  She denies ever having an EGD or colonoscopy.  No known family history of esophageal, gastric or colon cancer.  She denies having any profound fatigue, chest pain, palpitations, dizziness or shortness of breath.  She denies any prior history of anemia.  Infrequent NSAID use. ? ? ?  Latest Ref Rng & Units 05/19/2021  ? 10:50 AM 06/01/2012  ? 12:10 PM 04/23/2009  ?  1:14 PM  ?CBC  ?WBC 3.4 - 10.8  x10E3/uL 6.4   6.2     ?Hemoglobin 11.1 - 15.9 g/dL 7.7   32.411.6   40.113.6    ?Hematocrit 34.0 - 46.6 % 27.0   34.7   40.0    ?Platelets 150 - 450 x10E3/uL 557   207     ?  ? ?  Latest Ref Rng & Units 05/19/2021  ? 10:50 AM 06/01/2012  ? 12:10 PM 04/23/2009  ?  1:14 PM  ?CMP  ?Glucose 70 - 99 mg/dL 72   80   85    ?BUN 6 - 24 mg/dL 9   14   9     ?Creatinine 0.57 - 1.00 mg/dL 0.270.60   2.530.89   0.7    ?Sodium 134 - 144 mmol/L 142   143   142    ?Potassium 3.5 - 5.2 mmol/L 3.8   3.7   3.3    ?Chloride 96 - 106 mmol/L 108   110   110    ?CO2 20 - 29 mmol/L 24   25     ?Calcium 8.7 - 10.2 mg/dL 8.8   8.8     ?Total Protein 6.0 - 8.5 g/dL 6.0      ?Total Bilirubin 0.0 - 1.2 mg/dL <6.6<0.2      ?Alkaline Phos 44 - 121 IU/L 61      ?AST 0 - 40 IU/L 14      ?  ALT 0 - 32 IU/L 8      ?  ?Social History: She is single.  She has 3 daughters.  She is International aid/development worker.  She smokes marijuana daily for the past 2 to 3 years.  No alcohol use. ? ?Family History: No known family history of esophageal, gastric or colon cancer. ? ? ? ?Allergies  ?Allergen Reactions  ? Morphine And Related Itching  ? ? ?  ?Outpatient Encounter Medications as of 06/04/2021  ?Medication Sig  ? losartan (COZAAR) 50 MG tablet Take 1 tablet (50 mg total) by mouth daily. (Patient taking differently: Take 50 mg by mouth daily.)  ? ondansetron (ZOFRAN-ODT) 4 MG disintegrating tablet Take 1 tablet (4 mg total) by mouth every 8 (eight) hours as needed for nausea or vomiting.  ? pantoprazole (PROTONIX) 40 MG tablet TAKE 1 TABLET(40 MG) BY MOUTH DAILY  ? sucralfate (CARAFATE) 1 g tablet Take 1 tablet (1 g total) by mouth 4 (four) times daily -  with meals and at bedtime.  ? ?No facility-administered encounter medications on file as of 06/04/2021.  ? ? ?REVIEW OF SYSTEMS: ?Gen: See HPI. ?CV: Denies chest pain, palpitations or edema. ?Resp: Denies cough, shortness of breath of hemoptysis.  ?GI: See HPI ?GU : Denies urinary burning, blood in urine, increased urinary frequency or  incontinence. ?MS: Denies joint pain, muscles aches or weakness. ?Derm: Denies rash, itchiness, skin lesions or unhealing ulcers. ?Psych: Denies depression, anxiety, memory loss, suicidal ideation and confusion. ?Heme: Denies bruising, easy bleeding. ?Neuro:  + Headaches. ?Endo:  Denies any problems with DM, thyroid or adrenal function. ? ?PHYSICAL EXAM: ?Ht 5' 7.5" (1.715 m)   Wt 122 lb (55.3 kg)   BMI 18.83 kg/m?  ?BP 110/70   Pulse (!) 102   Ht 5' 7.5" (1.715 m)   Wt 122 lb (55.3 kg)   SpO2 99%   BMI 18.83 kg/m?  ? ?Wt Readings from Last 3 Encounters:  ?06/04/21 122 lb (55.3 kg)  ?05/19/21 121 lb 9.6 oz (55.2 kg)  ?04/14/21 126 lb 12.8 oz (57.5 kg)  ?  ?General: Thin 46 year old female in no acute distress ?Head: Normocephalic and atraumatic. ?Eyes:  Sclerae non-icteric, conjunctive pink. ?Ears: Normal auditory acuity. ?Mouth: Dentition intact.  Gold upper plate in use no ulcers or lesions.  ?Neck: Supple, no lymphadenopathy or thyromegaly.  ?Lungs: Clear bilaterally to auscultation without wheezes, crackles or rhonchi. ?Heart: Regular rate and rhythm. No murmur, rub or gallop appreciated.  ?Abdomen: Soft, nondistended.  Moderate tenderness to the LUQ, LLQ and RLQ without rebound or guarding.  No masses. No hepatosplenomegaly. Normoactive bowel sounds x 4 quadrants.  ?Rectal: Deferred ?Musculoskeletal: Symmetrical with no gross deformities. ?Skin: Warm and dry. No rash or lesions on visible extremities. ?Extremities: No edema. ?Neurological: Alert oriented x 4, no focal deficits.  ?Psychological:  Alert and cooperative. Normal mood and affect. ? ?ASSESSMENT AND PLAN: ? ?24) 46 year old female with significant microcytic anemia.  Hemoglobin 7.7.  ?-Stat CBC.  BMP.  Iron, TIBC, ferritin and B12 level. ?-EGD and colonoscopy benefits and risks discussed including risk with sedation, risk of bleeding, perforation and infection  ?-EGD and colonoscopy to be scheduled after the above lab and CTAP results  reviewed ?-Patient instructed go to the emergency room if she develops profound fatigue, chest pain, dizziness, shortness of breath  ?-If her hemoglobin level has significantly dropped, she may require evaluation in the ED and blood transfusion. ?-Await iron levels, she will likely need IV iron ? ?2) Left  mid to LLQ pain which has progressively worsened over the past 6 months. ?-Stat abdominal/pelvic CT with oral and IV contrast to rule out colitis/diverticulitis versus GI malignancy.  BUN/Cr level to be reviewed prior to patient receiving IV contrast ? ?3) Diarrhea x 6 months ?-C. difficile PCR ?-Diagnostic colonoscopy as ordered above ? ?4) 60 lbs weight loss in setting of left abdominal pain and diarrhea x 6 months ?-CTAP, EGD and colonoscopy as ordered above ?-Ensure or boost 1 to 2 cans daily ? ?5) N/V ?-Ondansetron 4 mg ODT 1 tab to be taken 30 minutes prior to breakfast and dinner ?-EGD and CTAP as ordered above ? ?6) low TSH level with normal T3 and T4 levels ?-Recommend endocrinology evaluation ? ? ? ? ? ?CC:  Georganna Skeans, MD ? ? ? ?

## 2021-06-04 NOTE — Patient Instructions (Addendum)
Continue Pantoprazole 40 MG every day, 30 minutes before breakfast. ? ?Take Ondansetron (ZOFRAN ODT) 4 MG disintegrating tablet- Place one tablet under the tongue 30 minutes before breakfast and dinner for nausea or vomiting. ? ?Go to the emergency room if you develop chest pain, shortness of breath, worsening diarrhea or profound weakness. ? ?We will call you to schedule the CT scan once we get insurance approval. ? ?Thank you for trusting me with your gastrointestinal care!   ? ?Arnaldo Natal, CRNP ? ? ? ?BMI: ? ?If you are age 78 or older, your body mass index should be between 23-30. Your Body mass index is 18.83 kg/m?Marland Kitchen If this is out of the aforementioned range listed, please consider follow up with your Primary Care Provider. ? ?If you are age 93 or younger, your body mass index should be between 19-25. Your Body mass index is 18.83 kg/m?Marland Kitchen If this is out of the aformentioned range listed, please consider follow up with your Primary Care Provider.  ? ?MY CHART: ? ?The [facility] providers would like to encourage you to use [facility] to communicate with providers for non-urgent requests or questions.  Due to long hold times on the telephone, sending your provider a message by [facility] may be a faster and more efficient way to get a response.  Please allow 48 business hours for a response.  Please remember that this is for non-urgent requests.  ? ?

## 2021-06-07 ENCOUNTER — Other Ambulatory Visit: Payer: Self-pay | Admitting: Family Medicine

## 2021-06-07 ENCOUNTER — Telehealth: Payer: Self-pay

## 2021-06-07 NOTE — Telephone Encounter (Signed)
Hi Joey, ? ?Did you ever get a response from this patient's insurance for the CT? Please route response to [facility]. Thank you! ? ?

## 2021-06-09 NOTE — Telephone Encounter (Signed)
Referral Notes  ?  ?Date Time Type Summary User  ?06/04/2021 3:03 PM EDT Insurance Verification Completed prior authorization form through [employer], faxed back along with clinicals Charyl Bigger, Donald Pore A  ?Note Text:  ?Completed prior authorization form through [employer], faxed back along with clinicals ?  ?Fax: [phone] ?CB [phone]  ? ?

## 2021-06-17 NOTE — Progress Notes (Signed)
Reviewed and agree with documentation and assessment and plan. K. Veena Maysoon Lozada , MD   

## 2021-07-21 ENCOUNTER — Other Ambulatory Visit: Payer: Self-pay | Admitting: Family Medicine

## 2021-07-21 DIAGNOSIS — K2990 Gastroduodenitis, unspecified, without bleeding: Secondary | ICD-10-CM

## 2021-07-21 DIAGNOSIS — I10 Essential (primary) hypertension: Secondary | ICD-10-CM

## 2021-07-21 NOTE — Telephone Encounter (Signed)
Requested Prescriptions  Pending Prescriptions Disp Refills  . losartan (COZAAR) 50 MG tablet [Pharmacy Med Name: LOSARTAN 50MG  TABLETS] 90 tablet 0    Sig: TAKE 1 TABLET(50 MG) BY MOUTH DAILY     Cardiovascular:  Angiotensin Receptor Blockers Passed - 07/21/2021  6:49 AM      Passed - Cr in normal range and within 180 days    Creatinine, Ser  Date Value Ref Range Status  05/19/2021 0.60 0.57 - 1.00 mg/dL Final         Passed - K in normal range and within 180 days    Potassium  Date Value Ref Range Status  05/19/2021 3.8 3.5 - 5.2 mmol/L Final         Passed - Patient is not pregnant      Passed - Last BP in normal range    BP Readings from Last 1 Encounters:  06/04/21 110/70         Passed - Valid encounter within last 6 months    Recent Outpatient Visits          2 months ago Diarrhea, unspecified type   Primary Care at [facility], [place]   3 months ago Essential hypertension   Primary Care at [facility], [place]             . pantoprazole (PROTONIX) 40 MG tablet [Pharmacy Med Name: PANTOPRAZOLE 40MG  TABLETS] 90 tablet 0    Sig: TAKE 1 TABLET(40 MG) BY MOUTH DAILY     Gastroenterology: Proton Pump Inhibitors Passed - 07/21/2021  6:49 AM      Passed - Valid encounter within last 12 months    Recent Outpatient Visits          2 months ago Diarrhea, unspecified type   Primary Care at [facility], [place]   3 months ago Essential hypertension   Primary Care at [facility], [place]

## 2021-08-11 ENCOUNTER — Ambulatory Visit (HOSPITAL_COMMUNITY): Admission: RE | Admit: 2021-08-11 | Payer: 59 | Source: Ambulatory Visit

## 2021-10-23 ENCOUNTER — Other Ambulatory Visit: Payer: Self-pay | Admitting: Family Medicine

## 2021-10-23 DIAGNOSIS — K2990 Gastroduodenitis, unspecified, without bleeding: Secondary | ICD-10-CM

## 2021-10-25 NOTE — Telephone Encounter (Signed)
Requested Prescriptions  Pending Prescriptions Disp Refills  . pantoprazole (PROTONIX) 40 MG tablet [Pharmacy Med Name: PANTOPRAZOLE 40MG  TABLETS] 90 tablet 0    Sig: TAKE 1 TABLET(40 MG) BY MOUTH DAILY     Gastroenterology: Proton Pump Inhibitors Passed - 10/23/2021  3:02 PM      Passed - Valid encounter within last 12 months    Recent Outpatient Visits          5 months ago Diarrhea, unspecified type   Primary Care at [facility], [place]   6 months ago Essential hypertension   Primary Care at [facility], [place]

## 2021-12-01 ENCOUNTER — Encounter: Payer: Self-pay | Admitting: Family Medicine

## 2021-12-01 ENCOUNTER — Other Ambulatory Visit: Payer: Self-pay | Admitting: Family Medicine

## 2021-12-01 DIAGNOSIS — K2990 Gastroduodenitis, unspecified, without bleeding: Secondary | ICD-10-CM

## 2021-12-07 NOTE — Telephone Encounter (Signed)
Patient was called and medication has been straighten out. Patient will call pharmacy to give her new medical insurance information

## 2022-01-21 ENCOUNTER — Other Ambulatory Visit: Payer: Self-pay | Admitting: *Deleted

## 2022-01-21 DIAGNOSIS — I10 Essential (primary) hypertension: Secondary | ICD-10-CM

## 2022-01-21 MED ORDER — LOSARTAN POTASSIUM 50 MG PO TABS: ORAL_TABLET | ORAL | 0 refills | Status: DC

## 2022-01-25 ENCOUNTER — Other Ambulatory Visit: Payer: Self-pay | Admitting: *Deleted

## 2022-01-25 DIAGNOSIS — I10 Essential (primary) hypertension: Secondary | ICD-10-CM

## 2022-01-25 MED ORDER — LOSARTAN POTASSIUM 50 MG PO TABS: ORAL_TABLET | ORAL | 0 refills | Status: DC

## 2022-03-03 ENCOUNTER — Encounter (HOSPITAL_COMMUNITY): Payer: Self-pay

## 2022-03-03 ENCOUNTER — Ambulatory Visit (HOSPITAL_COMMUNITY)
Admission: RE | Admit: 2022-03-03 | Discharge: 2022-03-03 | Disposition: A | Discharge status: Home / Self Care | Payer: Medicaid Other | Source: Ambulatory Visit | Attending: Internal Medicine | Admitting: Internal Medicine

## 2022-03-03 VITALS — BP 120/78 | HR 105 | Temp 98.1°F | Resp 18

## 2022-03-03 DIAGNOSIS — R1084 Generalized abdominal pain: Secondary | ICD-10-CM | POA: Insufficient documentation

## 2022-03-03 DIAGNOSIS — K219 Gastro-esophageal reflux disease without esophagitis: Secondary | ICD-10-CM | POA: Diagnosis not present

## 2022-03-03 DIAGNOSIS — K2990 Gastroduodenitis, unspecified, without bleeding: Secondary | ICD-10-CM | POA: Diagnosis present

## 2022-03-03 DIAGNOSIS — N3001 Acute cystitis with hematuria: Secondary | ICD-10-CM

## 2022-03-03 LAB — POCT URINALYSIS DIPSTICK, ED / UC
Bilirubin Urine: NEGATIVE
Glucose, UA: NEGATIVE mg/dL
Ketones, ur: NEGATIVE mg/dL
Nitrite: NEGATIVE
Protein, ur: 30 mg/dL — AB
Specific Gravity, Urine: >=1.03 (ref 1.005–1.030)
Urobilinogen, UA: 0.2 mg/dL (ref 0.0–1.0)
pH: 6.5 (ref 5.0–8.0)

## 2022-03-03 LAB — POC URINE PREG, ED
Preg Test, Ur: NEGATIVE
Preg Test, Ur: NEGATIVE

## 2022-03-03 MED ORDER — CEPHALEXIN 500 MG PO CAPS: 500.0000 mg | ORAL_CAPSULE | Freq: Two times a day (BID) | ORAL | 0 refills | Status: AC

## 2022-03-03 MED ORDER — ONDANSETRON 4 MG PO TBDP
4.0000 mg | ORAL_TABLET | Freq: Once | ORAL | Status: AC
  Administered 2022-03-03: 4 mg via ORAL

## 2022-03-03 MED ORDER — ONDANSETRON 4 MG PO TBDP
ORAL_TABLET | ORAL | Status: AC
  Filled 2022-03-03: qty 1

## 2022-03-03 MED ORDER — ONDANSETRON 4 MG PO TBDP: 4.0000 mg | ORAL_TABLET | Freq: Three times a day (TID) | ORAL | 0 refills | Status: DC | PRN

## 2022-03-03 MED ORDER — PANTOPRAZOLE SODIUM 40 MG PO TBEC: 40.0000 mg | DELAYED_RELEASE_TABLET | Freq: Every day | ORAL | 2 refills | Status: DC

## 2022-03-03 NOTE — Discharge Instructions (Addendum)
Take Protonix 40 mg once daily.  This medication will help reduce the amount of acid your stomach makes and therefore improve your reflux symptoms related to acid production.   Avoid spicy or acidic foods like tomatoes, chocolate, coffee, or acidic fruits like oranges as these can trigger symptoms.  Avoid drinking alcohol and attempt to reduce the amount of cigarettes you are smoking as is also contributes to acid reflux.  I have included acid reflux diet education in your packet for your review. Please also allow 2 hours after meals before lying flat to help prevent symptoms.   Take Zofran every 8 hours as needed for nausea and vomiting.  You may take Tylenol as needed for abdominal discomfort.  Your urine shows you likely have a urinary tract infection. I have sent your urine for culture to confirm this. We will go ahead and have you start taking antibiotics due to your symptoms.  Take antibiotic as directed.  (Keflex 500mg  every 12 hours for 7 days) If you develop diarrhea while taking this medication you may purchase an over-the-counter probiotic or eat yogurt with live active cultures.  To avoid GI upset please take this medication with food. I have sent your urine for culture to see what type of bacteria grows. We will call you if we need to change the treatment plan based on the results of your urine culture.  If your symptoms do not improve in the next 2-3 days with interventions, please return. Please follow up with your primary care provider for further management, evaluation, and ongoing wellness visits. Go to the emergency room for severe symptoms of shortness of breath, worsening or uncontrolled abdominal or chest pain, headache, light headedness, feeling faint, nausea, vomiting, bloody vomit or stools, black tarry stools, or any other new/severe symptoms. I hope you feel better!

## 2022-03-03 NOTE — ED Provider Notes (Signed)
[facility]    CSN: [id number] Arrival date & time: 03/03/22  1220      History   Chief Complaint Chief Complaint  Patient presents with   Abdominal Pain   Emesis   Diarrhea    HPI Dawn Walsh is a 47 y.o. female.   Patient presents to urgent care for evaluation of persistent nausea, vomiting, diarrhea, and left upper abdominal discomfort that started approximately 1 month ago.  She is also experiencing acid reflux symptoms.  Left upper quadrant abdominal discomfort is worse after eating.  She experiences nausea shortly after eating as well.  Denies recent intake of spicy, fatty, or greasy foods.  No recent changes in diet, recent antibiotic use, diarrhea, flank pain, or low back pain..  She does report she had a significant weight loss approximately 6 months ago but was able to gain some of the weight back after starting Protonix medication.  She lost her insurance and her ability to afford Protonix in October.  She recently was able to get back on Medicaid but has not been able to get in with a primary care provider for ongoing management of symptoms.  She has an appointment with [facility] in April for ongoing management of symptoms.  She is reporting urinary frequency without urinary urgency, dysuria, or vaginal symptoms.  No gross hematuria reported.  She does smoke marijuana frequently, however not every day.  No other drug use reported.  Denies history of abdominal surgeries.  Most recent episode of emesis was last night and was nonbloody/nonbilious.  She has had a few episodes of emesis over the last few days.  No recent blood/mucus in the stool.  Reports normal bowel movements that are soft.  Has not attempted use of over-the-counter medications prior to arrival urgent care for symptoms.   Abdominal Pain Associated symptoms: diarrhea and vomiting   Emesis Associated symptoms: abdominal pain and diarrhea   Diarrhea Associated  symptoms: abdominal pain and vomiting     Past Medical History:  Diagnosis Date   Anemia 06/04/2021   Hypertension     Patient Active Problem List   Diagnosis Date Noted   Anemia 06/04/2021   LLQ pain 06/04/2021    Past Surgical History:  Procedure Laterality Date   TUBAL LIGATION  04/09/2000    OB History   No obstetric history on file.      Home Medications    Prior to Admission medications   Medication Sig Start Date End Date Taking? Authorizing Provider  cephALEXin (KEFLEX) 500 MG capsule Take 1 capsule (500 mg total) by mouth 2 (two) times daily for 7 days. 03/03/22 03/10/22 Yes Talbot Grumbling, FNP  pantoprazole (PROTONIX) 40 MG tablet Take 1 tablet (40 mg total) by mouth daily. 03/03/22  Yes Talbot Grumbling, FNP  loperamide (IMODIUM) 2 MG capsule Take by mouth as needed for diarrhea or loose stools.    [provider]  losartan (COZAAR) 50 MG tablet TAKE 1 TABLET(50 MG) BY MOUTH DAILY 01/25/22   Dorna Mai, MD  ondansetron (ZOFRAN-ODT) 4 MG disintegrating tablet Take 1 tablet (4 mg total) by mouth every 8 (eight) hours as needed for nausea or vomiting. 03/03/22   Talbot Grumbling, FNP  sucralfate (CARAFATE) 1 g tablet Take 1 tablet (1 g total) by mouth 4 (four) times daily -  with meals and at bedtime. 05/19/21   Dorna Mai, MD    Family History Family History  Problem Relation Age  of Onset   Diabetes Mother    Breast cancer Mother    Colon cancer Neg Hx    Stomach cancer Neg Hx     Social History Social History   Tobacco Use   Smoking status: Some Days    Types: Cigars  Vaping Use   Vaping Use: Never used  Substance Use Topics   Alcohol use: No   Drug use: Yes    Frequency: 6.0 times per week    Types: Marijuana    Comment: every other day     Allergies   Morphine and related   Review of Systems Review of Systems  Gastrointestinal:  Positive for abdominal pain, diarrhea and vomiting.  Per HPI   Physical  Exam Triage Vital Signs ED Triage Vitals  Enc Vitals Group     BP 03/03/22 1243 120/78     Pulse Rate 03/03/22 1243 (!) 105     Resp 03/03/22 1243 18     Temp 03/03/22 1243 98.1 F (36.7 C)     Temp Source 03/03/22 1243 Oral     SpO2 03/03/22 1243 100 %     Weight --      Height --      Head Circumference --      Peak Flow --      Pain Score 03/03/22 1240 9     Pain Loc --      Pain Edu? --      Excl. in [place]? --    No data found.  Updated Vital Signs BP 120/78 (BP Location: Left Arm)   Pulse (!) 105   Temp 98.1 F (36.7 C) (Oral)   Resp 18   LMP 02/12/2022 (Approximate)   SpO2 100%   Visual Acuity Right Eye Distance:   Left Eye Distance:   Bilateral Distance:    Right Eye Near:   Left Eye Near:    Bilateral Near:     Physical Exam Vitals and nursing note reviewed.  Constitutional:      Appearance: She is not ill-appearing or toxic-appearing.  HENT:     Head: Normocephalic and atraumatic.     Right Ear: Hearing and external ear normal.     Left Ear: Hearing and external ear normal.     Nose: Nose normal.     Mouth/Throat:     Lips: Pink.     Mouth: Mucous membranes are moist.     Pharynx: No oropharyngeal exudate.  Eyes:     General: Lids are normal. Vision grossly intact. Gaze aligned appropriately.     Extraocular Movements: Extraocular movements intact.     Conjunctiva/sclera: Conjunctivae normal.  Cardiovascular:     Rate and Rhythm: Normal rate and regular rhythm.     Heart sounds: Normal heart sounds, S1 normal and S2 normal.  Pulmonary:     Effort: Pulmonary effort is normal. No respiratory distress.     Breath sounds: Normal breath sounds and air entry. No wheezing, rhonchi or rales.  Abdominal:     General: Abdomen is flat. Bowel sounds are normal. There is no distension or abdominal bruit. There are no signs of injury.     Palpations: Abdomen is soft.     Tenderness: There is abdominal tenderness in the epigastric area, suprapubic area, left  upper quadrant and left lower quadrant. There is no right CVA tenderness, left CVA tenderness or guarding.  Musculoskeletal:     Cervical back: Neck supple.  Skin:    General: Skin is  warm and dry.     Capillary Refill: Capillary refill takes less than 2 seconds.     Findings: No rash.  Neurological:     General: No focal deficit present.     Mental Status: She is alert and oriented to person, place, and time. Mental status is at baseline.     Cranial Nerves: No dysarthria or facial asymmetry.  Psychiatric:        Mood and Affect: Mood normal.        Speech: Speech normal.        Behavior: Behavior normal.        Thought Content: Thought content normal.        Judgment: Judgment normal.      UC Treatments / Results  Labs (all labs ordered are listed, but only abnormal results are displayed) Labs Reviewed  POCT URINALYSIS DIPSTICK, ED / UC - Abnormal; Notable for the following components:      Result Value   Hgb urine dipstick TRACE (*)    Protein, ur 30 (*)    Leukocytes,Ua SMALL (*)    All other components within normal limits  POC URINE PREG, ED  POC URINE PREG, ED    EKG   Radiology No results found.  Procedures Procedures (including critical care time)  Medications Ordered in UC Medications  ondansetron (ZOFRAN-ODT) disintegrating tablet 4 mg (4 mg Oral Given 03/03/22 1320)    Initial Impression / Assessment and Plan / UC Course  I have reviewed the triage vital signs and the nursing notes.  Pertinent labs & imaging results that were available during my care of the patient were reviewed by me and considered in my medical decision making (see chart for details).   1.  Generalized abdominal pain, GERD Patient symptoms and physical exam are consistent with gastroesophageal reflux disease.  Patient does have a history of gastritis and has been evaluated for this by [facility] gastroenterology in the past per chart review.  Patient never followed up with [facility]  gastroenterology and never received CT abdomen pelvis with contrast or EGD/colonoscopy (chart review).  Today she is clinically well-appearing with hemodynamically stable vital signs.  Heart rate is slightly elevated at 105, however this is not far from her baseline per her most recent visit (102).  She is not having any chest pain or shortness of breath.  She is feeling much better after I gave her Zofran 4 milligrams ODT.  No longer feels nauseous.  She may take Zofran 4 mg every 8 hours as needed for nausea and vomiting at home.  GERD precautions discussed.  Dietary guidelines regarding GERD given. I would like to start her back on Protonix 40 mg as I believe this will help greatly with her symptoms.  Abdominal exam is without peritoneal signs, therefore we will defer ED visit and imaging.  Advised to stay well-hydrated by increasing water intake.  2.  Urinary frequency, acute cystitis with hematuria Presentation is consistent with acute uncomplicated cystitis.Low suspicion for acute pyelonephritis. Low suspicion for kidney stone or infected stone. Urine pregnancy is negative. No indication for labs or imaging at this time.  Keflex sent to pharmacy. Denies allergies to antibiotics. Urine culture pending. Patient to push fluids to stay well hydrated and reduce intake of known urinary irritants.  Discussed physical exam and available lab work findings in clinic with patient.  Counseled patient regarding appropriate use of medications and potential side effects for all medications recommended or prescribed today. Discussed red flag signs  and symptoms of worsening condition,when to call the PCP office, return to urgent care, and when to seek higher level of care in the emergency department. Patient verbalizes understanding and agreement with plan. All questions answered. Patient discharged in stable condition.    Final Clinical Impressions(s) / UC Diagnoses   Final diagnoses:  Generalized abdominal  pain  Gastroesophageal reflux disease without esophagitis  Acute cystitis with hematuria     Discharge Instructions      Take Protonix 40 mg once daily.  This medication will help reduce the amount of acid your stomach makes and therefore improve your reflux symptoms related to acid production.   Avoid spicy or acidic foods like tomatoes, chocolate, coffee, or acidic fruits like oranges as these can trigger symptoms.  Avoid drinking alcohol and attempt to reduce the amount of cigarettes you are smoking as is also contributes to acid reflux.  I have included acid reflux diet education in your packet for your review. Please also allow 2 hours after meals before lying flat to help prevent symptoms.   Take Zofran every 8 hours as needed for nausea and vomiting.  You may take Tylenol as needed for abdominal discomfort.  Your urine shows you likely have a urinary tract infection. I have sent your urine for culture to confirm this. We will go ahead and have you start taking antibiotics due to your symptoms.  Take antibiotic as directed.  (Keflex 500mg  every 12 hours for 7 days) If you develop diarrhea while taking this medication you may purchase an over-the-counter probiotic or eat yogurt with live active cultures.  To avoid GI upset please take this medication with food. I have sent your urine for culture to see what type of bacteria grows. We will call you if we need to change the treatment plan based on the results of your urine culture.  If your symptoms do not improve in the next 2-3 days with interventions, please return. Please follow up with your primary care provider for further management, evaluation, and ongoing wellness visits. Go to the emergency room for severe symptoms of shortness of breath, worsening or uncontrolled abdominal or chest pain, headache, light headedness, feeling faint, nausea, vomiting, bloody vomit or stools, black tarry stools, or any other new/severe symptoms. I hope  you feel better!      ED Prescriptions     Medication Sig Dispense Auth. Provider   pantoprazole (PROTONIX) 40 MG tablet Take 1 tablet (40 mg total) by mouth daily. 30 tablet M, FNP   ondansetron (ZOFRAN-ODT) 4 MG disintegrating tablet Take 1 tablet (4 mg total) by mouth every 8 (eight) hours as needed for nausea or vomiting. 20 tablet M M, FNP   cephALEXin (KEFLEX) 500 MG capsule Take 1 capsule (500 mg total) by mouth 2 (two) times daily for 7 days. 14 capsule M, FNP      PDMP not reviewed this encounter.   Carlisle Beers, Carlisle Beers 03/03/22 1407

## 2022-03-03 NOTE — ED Triage Notes (Addendum)
Pt slates she has abdominal pain, vomiting, diarrhea, weight loss X 3 weeks its been worse. She states abdominal pain worse when she eats. She has seen a doctor in the past but she hasn't reestablished. She tried pepto OTC without relief.    She would like some iron pills she states that she eats a lot of starch and she feels like that's part of the reason she is hurting so much.

## 2022-03-05 LAB — URINE CULTURE

## 2022-05-11 ENCOUNTER — Ambulatory Visit: Payer: BLUE CROSS/BLUE SHIELD | Admitting: Critical Care Medicine

## 2022-05-11 NOTE — Progress Notes (Deleted)
   New Patient Office Visit  Subjective    Patient ID: Dawn Walsh, female    DOB: Oct 04, 1975  Age: 47 y.o. MRN: [id number]  CC: No chief complaint on file.   HPI Dawn Walsh presents to establish care ***  Outpatient Encounter Medications as of 05/11/2022  Medication Sig   loperamide (IMODIUM) 2 MG capsule Take by mouth as needed for diarrhea or loose stools.   losartan (COZAAR) 50 MG tablet TAKE 1 TABLET(50 MG) BY MOUTH DAILY   ondansetron (ZOFRAN-ODT) 4 MG disintegrating tablet Take 1 tablet (4 mg total) by mouth every 8 (eight) hours as needed for nausea or vomiting.   pantoprazole (PROTONIX) 40 MG tablet Take 1 tablet (40 mg total) by mouth daily.   sucralfate (CARAFATE) 1 g tablet Take 1 tablet (1 g total) by mouth 4 (four) times daily -  with meals and at bedtime.   No facility-administered encounter medications on file as of 05/11/2022.    Past Medical History:  Diagnosis Date   Anemia 06/04/2021   Hypertension     Past Surgical History:  Procedure Laterality Date   TUBAL LIGATION  04/09/2000    Family History  Problem Relation Age of Onset   Diabetes Mother    Breast cancer Mother    Colon cancer Neg Hx    Stomach cancer Neg Hx     Social History   Socioeconomic History   Marital status: Single    Spouse name: Not on file   Number of children: Not on file   Years of education: Not on file   Highest education level: Not on file  Occupational History   Not on file  Tobacco Use   Smoking status: Some Days    Types: Cigars   Smokeless tobacco: Not on file  Vaping Use   Vaping Use: Never used  Substance and Sexual Activity   Alcohol use: No   Drug use: Yes    Frequency: 6.0 times per week    Types: Marijuana    Comment: every other day   Sexual activity: Yes    Birth control/protection: None  Other Topics Concern   Not on file  Social History Narrative   Not on file   Social Determinants of Health   Financial Resource Strain: Not  on file  Food Insecurity: Not on file  Transportation Needs: Not on file  Physical Activity: Not on file  Stress: Not on file  Social Connections: Not on file  Intimate Partner Violence: Not on file    ROS      Objective    There were no vitals taken for this visit.  Physical Exam  {Labs (Optional):23779}    Assessment & Plan:   Problem List Items Addressed This Visit   None   No follow-ups on file.   Dawn Levans, MD

## 2022-05-12 ENCOUNTER — Encounter (INDEPENDENT_AMBULATORY_CARE_PROVIDER_SITE_OTHER): Payer: Self-pay | Admitting: Primary Care

## 2022-05-12 ENCOUNTER — Ambulatory Visit (INDEPENDENT_AMBULATORY_CARE_PROVIDER_SITE_OTHER): Payer: BLUE CROSS/BLUE SHIELD | Admitting: Primary Care

## 2022-05-12 VITALS — BP 129/81 | HR 97 | Ht 67.0 in | Wt 128.6 lb

## 2022-05-12 DIAGNOSIS — Z114 Encounter for screening for human immunodeficiency virus [HIV]: Secondary | ICD-10-CM

## 2022-05-12 DIAGNOSIS — K2990 Gastroduodenitis, unspecified, without bleeding: Secondary | ICD-10-CM

## 2022-05-12 DIAGNOSIS — R634 Abnormal weight loss: Secondary | ICD-10-CM

## 2022-05-12 DIAGNOSIS — Z1159 Encounter for screening for other viral diseases: Secondary | ICD-10-CM

## 2022-05-12 DIAGNOSIS — D509 Iron deficiency anemia, unspecified: Secondary | ICD-10-CM | POA: Diagnosis not present

## 2022-05-12 DIAGNOSIS — Z7689 Persons encountering health services in other specified circumstances: Secondary | ICD-10-CM | POA: Diagnosis not present

## 2022-05-12 DIAGNOSIS — R7989 Other specified abnormal findings of blood chemistry: Secondary | ICD-10-CM

## 2022-05-12 MED ORDER — PANTOPRAZOLE SODIUM 40 MG PO TBEC: 40.0000 mg | DELAYED_RELEASE_TABLET | Freq: Every day | ORAL | 2 refills | Status: DC

## 2022-05-12 NOTE — Progress Notes (Signed)
Established Patient Office Visit  Subjective   Patient ID: Dawn Walsh    DOB: 1975/08/10  Age: 47 y.o. MRN: [id number]  HPI  Dawn Walsh is a 47 year old female establishing care with a new provider.  Patient would not say exactly however was not comfortable with previous one.  She has complaints of abdominal pain with nausea and vomiting causing weight loss and loss of appetite.  She was previously referred to GI at that time she was unable to go.  Referral resent. Patient has No headache, No chest pain, No abdominal pain - No Nausea, No new weakness tingling or numbness, No Cough - shortness of breath  HPI     New Patient (Initial Visit)    Additional comments: GI problems eating and vomiting patient also states to having a"fluttering" in chest for a hour or two       Last edited by Dalene Carrow I, CMA on 05/12/2022  8:51 AM.      Active Ambulatory Problems    Diagnosis Date Noted   Anemia 06/04/2021   LLQ pain 06/04/2021   Resolved Ambulatory Problems    Diagnosis Date Noted   No Resolved Ambulatory Problems   Past Medical History:  Diagnosis Date   Hypertension      ROS  Comprehensive ROS Pertinent positive and negative noted in HPI     Objective:   Blood Pressure 129/81 (BP Location: Left Arm, Patient Position: Sitting, Cuff Size: Normal)   Pulse 97   Height  (1.702 m)   Weight 128 lb 9.6 oz (58.3 kg)   Oxygen Saturation 100%   Body Mass Index 20.14 kg/m    Physical exam: General: Vital signs reviewed.  Patient is well-developed and well-nourished, thin frame  in no acute distress and cooperative with exam. Head: Normocephalic and atraumatic. Eyes: EOMI, conjunctivae normal, no scleral icterus. Neck: Supple, trachea midline, normal ROM, no JVD, masses, thyromegaly, or carotid bruit present. Cardiovascular: RRR, S1 normal, S2 normal, no murmurs, gallops, or rubs. Pulmonary/Chest: Clear to auscultation bilaterally, no wheezes, rales, or  rhonchi. Abdominal: Soft, non-tender, non-distended, BS +, no masses, organomegaly, or guarding present. Musculoskeletal: No joint deformities, erythema, or stiffness, ROM full and nontender. Extremities: No lower extremity edema bilaterally,  pulses symmetric and intact bilaterally. No cyanosis or clubbing. Neurological: A&O x3, Strength is normal Skin: Warm, dry and intact. No rashes or erythema. Psychiatric: Normal mood and affect. speech and behavior is normal. Cognition and memory are normal.     No results found for any visits on 12/02/21.  The ASCVD Risk score (Arnett DK, et al., 2019) failed to calculate for the following reasons:   The systolic blood pressure is missing   Cannot find a previous HDL lab   Cannot find a previous total cholesterol lab    Assessment & Plan:  Aaliah was seen today for new patient (initial visit) and dizziness.  Diagnoses and all orders for this visit:  Gastritis and duodenitis 2/2    Weight loss, unintentional -     TSH + free T4 -     Ambulatory referral to Gastroenterology  Encounter to establish care - Iron deficiency anemia, unspecified iron deficiency anemia type -     CBC with Differential  Encounter for hepatitis C screening test for low risk patient -     Hepatitis C Antibody  Low TSH level -     TSH + free T4  Encounter for screening for HIV -  HIV Antibody (routine testing w rflx)  Grayce Sessions, NP

## 2022-05-12 NOTE — Patient Instructions (Signed)
Iron Deficiency Anemia, Adult  Iron deficiency anemia is a condition in which the concentration of red blood cells or hemoglobin in the blood is below normal because of too little iron. Hemoglobin is a substance in red blood cells that carries oxygen to the body's tissues. When the concentration of red blood cells or hemoglobin is too low, not enough oxygen reaches these tissues. Iron deficiency anemia is usually long-lasting, and it develops over time. It may or may not cause symptoms. It is a common type of anemia. What are the causes? This condition may be caused by: Not enough iron in the diet. Abnormal absorption in the gut. Blood loss. What increases the risk? You are more likely to develop this condition if you get menstrual periods (menstruate) or are pregnant. What are the signs or symptoms? Symptoms of this condition may include: Pale skin, lips, and nail beds. Weakness, dizziness, and getting tired easily. Shortness of breath when moving or exercising. Cold hands or feet. Mild anemia may not cause any symptoms. How is this diagnosed? This condition is diagnosed based on: Your medical history. A physical exam. Blood tests. How is this treated? This condition is treated by correcting the cause of your iron deficiency. Treatment may involve: Adding iron-rich foods to your diet. Taking iron supplements. If you are pregnant or breastfeeding, you may need to take extra iron because your normal diet usually does not provide the amount of iron that you need. Increasing vitamin C intake. Vitamin C helps your body absorb iron. Your health care provider may recommend that you take iron supplements along with a glass of orange juice or a vitamin C supplement. Medicines to make heavy menstrual flow lighter. Surgery or additional testing procedures to determine the cause of your anemia. You may need repeat blood tests to determine whether treatment is working. If the treatment does not  seem to be working, you may need more tests. Follow these instructions at home: Medicines Take over-the-counter and prescription medicines only as told by your health care provider. This includes iron supplements and vitamins. This is important because too much iron can be harmful. For the best iron absorption, you should take iron supplements when your stomach is empty. If you cannot tolerate them on an empty stomach, you may need to take them with food. Do not drink milk or take antacids at the same time as your iron supplements. Milk and antacids may interfere with how your body absorbs iron. Iron supplements may turn stool (feces) a darker color and it may appear black. If you cannot tolerate taking iron supplements by mouth, talk with your health care provider about taking them through an IV or through an injection into a muscle. Eating and drinking Talk with your health care provider before changing your diet. Your provider may recommend that you eat foods that contain a lot of iron, such as: Liver. Low-fat (lean) beef. Breads and cereals that have iron added to them (are fortified). Eggs. Dried fruit. Dark green, leafy vegetables. To help your body use the iron from iron-rich foods, eat those foods at the same time as fresh fruits and vegetables that are high in vitamin C. Foods that are high in vitamin C include: Oranges. Peppers. Tomatoes. Mangoes. Managing constipation If you are taking an iron supplement, it may cause constipation. To prevent or treat constipation, you may need to: Drink enough fluid to keep your urine pale yellow. Take over-the-counter or prescription medicines. Eat foods that are high in fiber, such   as beans, whole grains, and fresh fruits and vegetables. Limit foods that are high in fat and processed sugars, such as fried or sweet foods. General instructions Return to your normal activities as told by your health care provider. Ask your health care provider  what activities are safe for you. Keep all follow-up visits. Contact a health care provider if: You feel nauseous or you vomit. You feel weak. You become light-headed when getting up from a sitting or lying down position. You have unexplained sweating. You develop symptoms of constipation. You have a heaviness in your chest. You have trouble breathing with physical activity. Get help right away if: You faint. If this happens, do not drive yourself to the hospital. You have an irregular or rapid heartbeat. Summary Iron deficiency anemia is a condition in which the concentration of red blood cells or hemoglobin in the blood is below normal because of too little iron. This condition is treated by correcting the cause of your iron deficiency. Take over-the-counter and prescription medicines only as told by your health care provider. This includes iron supplements and vitamins. To help your body use the iron from iron-rich foods, eat those foods at the same time as fresh fruits and vegetables that are high in vitamin C. Seek medical help if you have signs or symptoms of worsening anemia. This information is not intended to replace advice given to you by your health care provider. Make sure you discuss any questions you have with your health care provider. Document Revised: 02/17/2021 Document Reviewed: 02/17/2021 Elsevier Patient Education  2023 [employer]vier Inc.  

## 2022-05-16 ENCOUNTER — Encounter (INDEPENDENT_AMBULATORY_CARE_PROVIDER_SITE_OTHER): Payer: Self-pay | Admitting: Primary Care

## 2022-05-20 ENCOUNTER — Other Ambulatory Visit (INDEPENDENT_AMBULATORY_CARE_PROVIDER_SITE_OTHER): Payer: BLUE CROSS/BLUE SHIELD

## 2022-05-21 LAB — CBC WITH DIFFERENTIAL/PLATELET
Basophils Absolute: 0 10*3/uL (ref 0.0–0.2)
Basos: 1 %
EOS (ABSOLUTE): 0.2 10*3/uL (ref 0.0–0.4)
Eos: 3 %
Hematocrit: 23.1 % — ABNORMAL LOW (ref 34.0–46.6)
Hemoglobin: 5.9 g/dL — CL (ref 11.1–15.9)
Immature Grans (Abs): 0 10*3/uL (ref 0.0–0.1)
Immature Granulocytes: 0 %
Lymphocytes Absolute: 1.3 10*3/uL (ref 0.7–3.1)
Lymphs: 22 %
MCH: 16.1 pg — ABNORMAL LOW (ref 26.6–33.0)
MCHC: 25.5 g/dL — ABNORMAL LOW (ref 31.5–35.7)
MCV: 63 fL — ABNORMAL LOW (ref 79–97)
Monocytes Absolute: 0.4 10*3/uL (ref 0.1–0.9)
Monocytes: 7 %
Neutrophils Absolute: 4 10*3/uL (ref 1.4–7.0)
Neutrophils: 67 %
Platelets: 463 10*3/uL — ABNORMAL HIGH (ref 150–450)
RBC: 3.66 x10E6/uL — ABNORMAL LOW (ref 3.77–5.28)
RDW: 17.5 % — ABNORMAL HIGH (ref 11.7–15.4)
WBC: 5.9 10*3/uL (ref 3.4–10.8)

## 2022-05-21 LAB — TSH+FREE T4
Free T4: 1.25 ng/dL (ref 0.82–1.77)
TSH: 0.399 u[IU]/mL — ABNORMAL LOW (ref 0.450–4.500)

## 2022-05-21 LAB — HEPATITIS C ANTIBODY: Hep C Virus Ab: NONREACTIVE

## 2022-05-21 LAB — HIV ANTIBODY (ROUTINE TESTING W REFLEX): HIV Screen 4th Generation wRfx: NONREACTIVE

## 2022-05-23 ENCOUNTER — Observation Stay (HOSPITAL_COMMUNITY)
Admission: EM | Admit: 2022-05-23 | Discharge: 2022-05-25 | Disposition: A | Discharge status: Home / Self Care | Payer: BLUE CROSS/BLUE SHIELD | Attending: Student | Admitting: Student

## 2022-05-23 ENCOUNTER — Other Ambulatory Visit: Payer: Self-pay

## 2022-05-23 ENCOUNTER — Encounter (HOSPITAL_COMMUNITY): Payer: Self-pay

## 2022-05-23 ENCOUNTER — Observation Stay (HOSPITAL_COMMUNITY): Payer: BLUE CROSS/BLUE SHIELD

## 2022-05-23 DIAGNOSIS — F17290 Nicotine dependence, other tobacco product, uncomplicated: Secondary | ICD-10-CM | POA: Insufficient documentation

## 2022-05-23 DIAGNOSIS — R1012 Left upper quadrant pain: Secondary | ICD-10-CM | POA: Diagnosis not present

## 2022-05-23 DIAGNOSIS — Z9851 Tubal ligation status: Secondary | ICD-10-CM

## 2022-05-23 DIAGNOSIS — D75839 Thrombocytosis, unspecified: Secondary | ICD-10-CM | POA: Diagnosis not present

## 2022-05-23 DIAGNOSIS — Z79899 Other long term (current) drug therapy: Secondary | ICD-10-CM | POA: Diagnosis not present

## 2022-05-23 DIAGNOSIS — K2970 Gastritis, unspecified, without bleeding: Secondary | ICD-10-CM | POA: Diagnosis present

## 2022-05-23 DIAGNOSIS — D649 Anemia, unspecified: Principal | ICD-10-CM | POA: Diagnosis present

## 2022-05-23 DIAGNOSIS — Z681 Body mass index (BMI) 19 or less, adult: Secondary | ICD-10-CM

## 2022-05-23 DIAGNOSIS — K2990 Gastroduodenitis, unspecified, without bleeding: Secondary | ICD-10-CM

## 2022-05-23 DIAGNOSIS — K5090 Crohn's disease, unspecified, without complications: Secondary | ICD-10-CM | POA: Diagnosis not present

## 2022-05-23 DIAGNOSIS — K529 Noninfective gastroenteritis and colitis, unspecified: Secondary | ICD-10-CM

## 2022-05-23 DIAGNOSIS — F1210 Cannabis abuse, uncomplicated: Secondary | ICD-10-CM | POA: Diagnosis present

## 2022-05-23 DIAGNOSIS — R109 Unspecified abdominal pain: Secondary | ICD-10-CM | POA: Insufficient documentation

## 2022-05-23 DIAGNOSIS — Z885 Allergy status to narcotic agent status: Secondary | ICD-10-CM

## 2022-05-23 DIAGNOSIS — R933 Abnormal findings on diagnostic imaging of other parts of digestive tract: Secondary | ICD-10-CM | POA: Insufficient documentation

## 2022-05-23 DIAGNOSIS — K6289 Other specified diseases of anus and rectum: Secondary | ICD-10-CM | POA: Insufficient documentation

## 2022-05-23 DIAGNOSIS — D509 Iron deficiency anemia, unspecified: Secondary | ICD-10-CM | POA: Diagnosis not present

## 2022-05-23 DIAGNOSIS — R1032 Left lower quadrant pain: Secondary | ICD-10-CM | POA: Diagnosis present

## 2022-05-23 DIAGNOSIS — I10 Essential (primary) hypertension: Secondary | ICD-10-CM | POA: Diagnosis not present

## 2022-05-23 DIAGNOSIS — K648 Other hemorrhoids: Secondary | ICD-10-CM | POA: Insufficient documentation

## 2022-05-23 DIAGNOSIS — R112 Nausea with vomiting, unspecified: Secondary | ICD-10-CM

## 2022-05-23 DIAGNOSIS — K633 Ulcer of intestine: Secondary | ICD-10-CM | POA: Diagnosis not present

## 2022-05-23 DIAGNOSIS — K644 Residual hemorrhoidal skin tags: Secondary | ICD-10-CM | POA: Diagnosis present

## 2022-05-23 DIAGNOSIS — R0602 Shortness of breath: Secondary | ICD-10-CM | POA: Diagnosis present

## 2022-05-23 DIAGNOSIS — Z833 Family history of diabetes mellitus: Secondary | ICD-10-CM

## 2022-05-23 DIAGNOSIS — R634 Abnormal weight loss: Secondary | ICD-10-CM | POA: Diagnosis not present

## 2022-05-23 DIAGNOSIS — Z803 Family history of malignant neoplasm of breast: Secondary | ICD-10-CM

## 2022-05-23 DIAGNOSIS — K8020 Calculus of gallbladder without cholecystitis without obstruction: Secondary | ICD-10-CM | POA: Diagnosis present

## 2022-05-23 DIAGNOSIS — Z597 Insufficient social insurance and welfare support: Secondary | ICD-10-CM

## 2022-05-23 DIAGNOSIS — R5383 Other fatigue: Secondary | ICD-10-CM | POA: Diagnosis present

## 2022-05-23 LAB — IRON AND TIBC
Iron: 12 ug/dL — ABNORMAL LOW (ref 28–170)
Saturation Ratios: 2 % — ABNORMAL LOW (ref 10.4–31.8)
TIBC: 592 ug/dL — ABNORMAL HIGH (ref 250–450)
UIBC: 580 ug/dL

## 2022-05-23 LAB — CBC WITH DIFFERENTIAL/PLATELET
Abs Immature Granulocytes: 0.01 10*3/uL (ref 0.00–0.07)
Basophils Absolute: 0.1 10*3/uL (ref 0.0–0.1)
Basophils Relative: 1 %
Eosinophils Absolute: 0.1 10*3/uL (ref 0.0–0.5)
Eosinophils Relative: 2 %
HCT: 22 % — ABNORMAL LOW (ref 36.0–46.0)
Hemoglobin: 5.7 g/dL — CL (ref 12.0–15.0)
Immature Granulocytes: 0 %
Lymphocytes Relative: 19 %
Lymphs Abs: 1.1 10*3/uL (ref 0.7–4.0)
MCH: 16.8 pg — ABNORMAL LOW (ref 26.0–34.0)
MCHC: 25.9 g/dL — ABNORMAL LOW (ref 30.0–36.0)
MCV: 64.7 fL — ABNORMAL LOW (ref 80.0–100.0)
Monocytes Absolute: 0.4 10*3/uL (ref 0.1–1.0)
Monocytes Relative: 6 %
Neutro Abs: 4.3 10*3/uL (ref 1.7–7.7)
Neutrophils Relative %: 72 %
Platelets: 456 10*3/uL — ABNORMAL HIGH (ref 150–400)
RBC: 3.4 MIL/uL — ABNORMAL LOW (ref 3.87–5.11)
RDW: 19.8 % — ABNORMAL HIGH (ref 11.5–15.5)
WBC: 5.9 10*3/uL (ref 4.0–10.5)
nRBC: 0 % (ref 0.0–0.2)

## 2022-05-23 LAB — COMPREHENSIVE METABOLIC PANEL
ALT: 12 U/L (ref 0–44)
AST: 20 U/L (ref 15–41)
Albumin: 2.8 g/dL — ABNORMAL LOW (ref 3.5–5.0)
Alkaline Phosphatase: 60 U/L (ref 38–126)
Anion gap: 7 (ref 5–15)
BUN: 6 mg/dL (ref 6–20)
CO2: 25 mmol/L (ref 22–32)
Calcium: 8.7 mg/dL — ABNORMAL LOW (ref 8.9–10.3)
Chloride: 105 mmol/L (ref 98–111)
Creatinine, Ser: 0.8 mg/dL (ref 0.44–1.00)
GFR, Estimated: >60 mL/min (ref >60)
Glucose, Bld: 103 mg/dL — ABNORMAL HIGH (ref 70–99)
Potassium: 3.3 mmol/L — ABNORMAL LOW (ref 3.5–5.1)
Sodium: 137 mmol/L (ref 135–145)
Total Bilirubin: 0.5 mg/dL (ref 0.3–1.2)
Total Protein: 6.5 g/dL (ref 6.5–8.1)

## 2022-05-23 LAB — BPAM RBC
Blood Product Expiration Date: 202405052359
Unit Type and Rh: 6200
Unit Type and Rh: 6200

## 2022-05-23 LAB — PREPARE RBC (CROSSMATCH)

## 2022-05-23 LAB — POC OCCULT BLOOD, ED: Fecal Occult Bld: POSITIVE — AB

## 2022-05-23 LAB — ABO/RH: ABO/RH(D): A POS

## 2022-05-23 LAB — MAGNESIUM: Magnesium: 1.8 mg/dL (ref 1.7–2.4)

## 2022-05-23 LAB — RETICULOCYTES
Immature Retic Fract: 16.8 % — ABNORMAL HIGH (ref 2.3–15.9)
RBC.: 2.74 MIL/uL — ABNORMAL LOW (ref 3.87–5.11)
Retic Count, Absolute: 29.6 10*3/uL (ref 19.0–186.0)
Retic Ct Pct: 1.1 % (ref 0.4–3.1)

## 2022-05-23 LAB — FERRITIN: Ferritin: 1 ng/mL — ABNORMAL LOW (ref 11–307)

## 2022-05-23 LAB — I-STAT BETA HCG BLOOD, ED (MC, WL, AP ONLY): I-stat hCG, quantitative: <5 m[IU]/mL (ref <5)

## 2022-05-23 MED ORDER — PANTOPRAZOLE SODIUM 40 MG IV SOLR
40.0000 mg | Freq: Two times a day (BID) | INTRAVENOUS | Status: DC
  Administered 2022-05-23 – 2022-05-25 (×4): 40 mg via INTRAVENOUS
  Filled 2022-05-23 (×4): qty 10

## 2022-05-23 MED ORDER — SODIUM CHLORIDE 0.9% IV SOLUTION: Freq: Once | INTRAVENOUS | Status: DC

## 2022-05-23 MED ORDER — PEG-KCL-NACL-NASULF-NA ASC-C 100 G PO SOLR: 1.0000 | Freq: Once | ORAL | Status: DC

## 2022-05-23 MED ORDER — PEG-KCL-NACL-NASULF-NA ASC-C 100 G PO SOLR
0.5000 | Freq: Once | ORAL | Status: AC
  Administered 2022-05-23: 100 g via ORAL
  Filled 2022-05-23: qty 1

## 2022-05-23 MED ORDER — ACETAMINOPHEN 650 MG RE SUPP: 650.0000 mg | Freq: Four times a day (QID) | RECTAL | Status: DC | PRN

## 2022-05-23 MED ORDER — IOHEXOL 350 MG/ML SOLN
75.0000 mL | Freq: Once | INTRAVENOUS | Status: AC | PRN
  Administered 2022-05-23: 75 mL via INTRAVENOUS

## 2022-05-23 MED ORDER — PANTOPRAZOLE SODIUM 40 MG PO TBEC: 40.0000 mg | DELAYED_RELEASE_TABLET | Freq: Every day | ORAL | Status: DC

## 2022-05-23 MED ORDER — SODIUM CHLORIDE 0.9% FLUSH
3.0000 mL | Freq: Two times a day (BID) | INTRAVENOUS | Status: DC
  Administered 2022-05-23 – 2022-05-25 (×5): 3 mL via INTRAVENOUS

## 2022-05-23 MED ORDER — IOHEXOL 9 MG/ML PO SOLN
500.0000 mL | ORAL | Status: AC
  Administered 2022-05-23 (×2): 500 mL via ORAL

## 2022-05-23 MED ORDER — POLYETHYLENE GLYCOL 3350 17 G PO PACK: 17.0000 g | PACK | Freq: Every day | ORAL | Status: DC | PRN

## 2022-05-23 MED ORDER — PEG-KCL-NACL-NASULF-NA ASC-C 100 G PO SOLR
0.5000 | Freq: Once | ORAL | Status: AC
  Administered 2022-05-24: 100 g via ORAL
  Filled 2022-05-23: qty 1

## 2022-05-23 MED ORDER — ACETAMINOPHEN 325 MG PO TABS: 650.0000 mg | ORAL_TABLET | Freq: Four times a day (QID) | ORAL | Status: DC | PRN

## 2022-05-23 MED ORDER — HYDROCODONE-ACETAMINOPHEN 5-325 MG PO TABS
1.0000 | ORAL_TABLET | Freq: Four times a day (QID) | ORAL | Status: DC | PRN
  Administered 2022-05-23 – 2022-05-24 (×2): 1 via ORAL
  Filled 2022-05-23 (×2): qty 1

## 2022-05-23 NOTE — ED Notes (Signed)
ED TO INPATIENT HANDOFF REPORT  ED Nurse Name and Phone #: [phone]  S Name/Age/Gender Dawn Walsh 47 y.o. female Room/Bed: 026C/026C  Code Status   Code Status: Full Code  Home/SNF/Other Home Patient oriented to: self, place, time, and situation Is this baseline? Yes   Triage Complete: Triage complete  Chief Complaint Symptomatic anemia [D64.9]  Triage Note Pt arrived POV from home stating her PCP called because she had blood work done Friday and her hemoglobin is low. Pt endorses SHOB and weakness.    Allergies Allergies  Allergen Reactions   Morphine And Related Itching    Level of Care/Admitting Diagnosis ED Disposition     ED Disposition  Admit   Condition  --   Comment  Hospital Area: [facility] [[id number]]  Level of Care: Telemetry Medical [104]  May place patient in observation at [facility] or [facility] if equivalent level of care is available:: No  Covid Evaluation: Asymptomatic - no recent exposure (last 10 days) testing not required  Diagnosis: Symptomatic anemia [1478295]  Admitting Physician: Synetta Fail [[id number]]  Attending Physician: Synetta Fail [[id number]]          B Medical/Surgery History Past Medical History:  Diagnosis Date   Anemia 06/04/2021   Hypertension    Past Surgical History:  Procedure Laterality Date   TUBAL LIGATION  04/09/2000     A IV Location/Drains/Wounds Patient Lines/Drains/Airways Status     Active Line/Drains/Airways     Name Placement date Placement time Site Days   Peripheral IV 05/23/22 20 G Right Antecubital 05/23/22  0119  Antecubital  less than 1            Intake/Output Last 24 hours  Intake/Output Summary (Last 24 hours) at 05/23/2022 1311 Last data filed at 05/23/2022 1302 Gross per 24 hour  Intake 3 ml  Output --  Net 3 ml    Labs/Imaging Results for orders placed or performed during the hospital encounter of 05/23/22 (from the past 48 hour(s))   Comprehensive metabolic panel     Status: Abnormal   Collection Time: 05/23/22 10:31 AM  Result Value Ref Range   Sodium 137 135 - 145 mmol/L   Potassium 3.3 (L) 3.5 - 5.1 mmol/L   Chloride 105 98 - 111 mmol/L   CO2 25 22 - 32 mmol/L   Glucose, Bld 103 (H) 70 - 99 mg/dL    Comment: Glucose reference range applies only to samples taken after fasting for at least 8 hours.   BUN 6 6 - 20 mg/dL   Creatinine, Ser 2.95 0.44 - 1.00 mg/dL   Calcium 8.7 (L) 8.9 - 10.3 mg/dL   Total Protein 6.5 6.5 - 8.1 g/dL   Albumin 2.8 (L) 3.5 - 5.0 g/dL   AST 20 15 - 41 U/L   ALT 12 0 - 44 U/L   Alkaline Phosphatase 60 38 - 126 U/L   Total Bilirubin 0.5 0.3 - 1.2 mg/dL   GFR, Estimated >28 >41 mL/min    Comment: (NOTE) Calculated using the CKD-EPI Creatinine Equation (2021)    Anion gap 7 5 - 15    Comment: Performed at [facility], [address] [place], [place] [address]  CBC with Differential     Status: Abnormal   Collection Time: 05/23/22 10:31 AM  Result Value Ref Range   WBC 5.9 4.0 - 10.5 K/uL   RBC 3.40 (L) 3.87 - 5.11 MIL/uL   Hemoglobin 5.7 (LL)  12.0 - 15.0 g/dL    Comment: REPEATED TO VERIFY Reticulocyte Hemoglobin testing may be clinically indicated, consider ordering this additional test [id number] THIS CRITICAL RESULT HAS VERIFIED AND BEEN CALLED TO Zoee Heeney,RN BY ZELDA BEECH ON 04 29 2024 AT 1118, AND HAS BEEN READ BACK.     HCT 22.0 (L) 36.0 - 46.0 %   MCV 64.7 (L) 80.0 - 100.0 fL   MCH 16.8 (L) 26.0 - 34.0 pg   MCHC 25.9 (L) 30.0 - 36.0 g/dL   RDW 08.6 (H) 57.8 - 46.9 %   Platelets 456 (H) 150 - 400 K/uL    Comment: REPEATED TO VERIFY   nRBC 0.0 0.0 - 0.2 %   Neutrophils Relative % 72 %   Neutro Abs 4.3 1.7 - 7.7 K/uL   Lymphocytes Relative 19 %   Lymphs Abs 1.1 0.7 - 4.0 K/uL   Monocytes Relative 6 %   Monocytes Absolute 0.4 0.1 - 1.0 K/uL   Eosinophils Relative 2 %   Eosinophils Absolute 0.1 0.0 - 0.5 K/uL   Basophils Relative 1 %   Basophils Absolute  0.1 0.0 - 0.1 K/uL   Immature Granulocytes 0 %   Abs Immature Granulocytes 0.01 0.00 - 0.07 K/uL   Ovalocytes PRESENT     Comment: Performed at [facility], [address] [place], [place] [address]  Type and screen [facility]     Status: None   Collection Time: 05/23/22 10:37 AM  Result Value Ref Range   ABO/RH(D) A POS    Antibody Screen NEG    Sample Expiration      05/26/2022,2359 Performed at [facility], [address], [place], [place] [address]   Prepare RBC (crossmatch)     Status: None   Collection Time: 05/23/22 11:29 AM  Result Value Ref Range   Order Confirmation      ORDER PROCESSED BY BLOOD BANK Performed at [facility], [address], [place], [place] [address]   POC occult blood, ED     Status: Abnormal   Collection Time: 05/23/22 11:49 AM  Result Value Ref Range   Fecal Occult Bld POSITIVE (A) NEGATIVE  I-Stat beta hCG blood, ED ([facility], [facility], [facility] only)     Status: None   Collection Time: 05/23/22 11:49 AM  Result Value Ref Range   I-stat hCG, quantitative <5.0 <5 mIU/mL   Comment 3            Comment:   GEST. AGE      CONC.  (mIU/mL)   <=1 WEEK        5 - 50     2 WEEKS       50 - 500     3 WEEKS       100 - 10,000     4 WEEKS     1,000 - 30,000        FEMALE AND NON-PREGNANT FEMALE:     LESS THAN 5 mIU/mL    No results found.  Pending Labs Unresulted Labs (From admission, onward)     Start     Ordered   05/24/22 0500  Basic metabolic panel  Tomorrow morning,   R        05/23/22 1219   05/24/22 0500  CBC  Tomorrow morning,   R        05/23/22 1219   05/23/22 1219  Magnesium  Add-on,   AD  05/23/22 1219   05/23/22 1219  Reticulocytes  Add-on,   AD        05/23/22 1219   05/23/22 1137  ABO/Rh  Once,   STAT        05/23/22 1137   05/23/22 1130  Iron and TIBC  Once,   URGENT        05/23/22 1129   05/23/22 1130  Ferritin (Iron Binding Protein)  Once,   URGENT        05/23/22 1129             Vitals/Pain Today's Vitals   05/23/22 1026 05/23/22 1027 05/23/22 1221  BP: 127/86  121/81  Pulse: (!) 114  (!) 101  Resp: 16  20  Temp: 98.5 F (36.9 C)    SpO2: 100%  100%  Weight:  59 kg   Height:  5\' 8"  (1.727 m)   PainSc:  0-No pain     Isolation Precautions No active isolations  Medications Medications  0.9 %  sodium chloride infusion (Manually program via Guardrails IV Fluids) (0 mLs Intravenous Hold 05/23/22 1302)  sodium chloride flush (NS) 0.9 % injection 3 mL (3 mLs Intravenous Given 05/23/22 1302)  acetaminophen (TYLENOL) tablet 650 mg (has no administration in time range)    Or  acetaminophen (TYLENOL) suppository 650 mg (has no administration in time range)  polyethylene glycol (MIRALAX / GLYCOLAX) packet 17 g (has no administration in time range)    Mobility walks     Focused Assessments Cardiac Assessment Handoff:    No results found for: "CKTOTAL", "CKMB", "CKMBINDEX", "TROPONINI" No results found for: "DDIMER" Does the Patient currently have chest pain?  Cardiac rhythm regular   R Recommendations: See Admitting Provider Note  Report given to:   Additional Notes:

## 2022-05-23 NOTE — ED Notes (Signed)
Hemoccult card reads Positive . ED-Lab.

## 2022-05-23 NOTE — Consult Note (Addendum)
Attending physician's note   I have taken a history, reviewed the chart, and examined the patient. I performed a substantive portion of this encounter, including complete performance of at least one of the key components, in conjunction with the APP. I agree with the APP's note, impression, and recommendations with my edits.   47 year old female with medical history as outlined below, presents with acute on chronic anemia, symptomatic anemia, heme positive stool, in the setting of left-sided abdominal pain and intermittent nausea/vomiting over the last year with associated 50# wt loss.  Was seen in the GI clinic for the abdominal pain, nausea/vomiting (along with diarrhea at that time) on 06/04/2021 it was noted to have microcytic anemia, iron deficiency, normal B12/folate, and recommended EGD/colonoscopy and CT, which were never completed by the patient.  Admission evaluation today notable for the following: - H/H 5.7/22, MCV/RDW 65/20 - K3.3, otherwise normal BMP - Albumin 2.8, otherwise normal liver enzymes - FOBT positive  Exam with pinpoint tenderness in the left mid abdomen, positive Carnett's sign.  1) Iron deficiency anemia 2) Symptomatic anemia 3) Abdominal pain 4) Unintentional weight loss 5) Nausea/vomiting - CT abdomen/pelvis today - High-dose PPI as outlined - Plan for EGD/colonoscopy on this admission - Clears today and bowel prep this evening in anticipation of EGD/colonoscopy tomorrow - RBC transfusion - Posttransfusion CBC check with additional blood products as needed per protocol  The indications, risks, and benefits of EGD and colonoscopy were explained to the patient in detail. Risks include but are not limited to bleeding, perforation, adverse reaction to medications, and cardiopulmonary compromise. Sequelae include but are not limited to the possibility of surgery, hospitalization, and mortality. The patient verbalized understanding and wished to proceed.  Further recommendations pending results of the exam.    Doristine Locks, DO, FACG [phone] office          Consultation  Referring Provider:  [facility]  Primary Care Physician:  Grayce Sessions, NP Primary Gastroenterologist:  [facility] Alcide Evener, NP       Reason for Consultation:    acute on chronic anemia, abdominal pain, weight loss   LOS: 0 days          HPI:   Dawn Walsh is a 47 y.o. female with past medical history significant for anemia, hypertension, s/p tubal ligation 03/2000, presents for evaluation of acute on chronic anemia, abdominal pain, and weight loss.  Seen in office 05/2021 by Alcide Evener, NP for weight loss and left-sided abdominal pain.  Hemoglobin 7.7 05/2021. Prior to that, hgb was 11.6 in 2014. No other records of CBC until PCP visit 3 days ago.  Patient states she has been without health insurance and has not been able to follow through with GI recommendations of CT scan and EGD. Recently was able to get medicaid and made appt with PCP. CBC with hgb of 5.9 at PCP. Patient subsequently recommended to go to ED. On admission hemoglobin 5.7.  BUN 6, creatinine 0.80. CT ab pelvis with contrast ordered.  Patient states she has had continued left sided pain mid to upper quadrant abdominal pain for the last year becoming progressively worse.  Exacerbated with eating and will wake her up at night. Intermittent. Will have to take tylenol PM to go to sleep due to pain. Denies melena/hematochezia. Reports some intermittent nausea, no vomiting. Patient was prescribed pantoprazole and this helped her pain in the past, no longer working at this time.  States she has lost 50  lbs over the course of one year unintentionally due to decreased caloric intake from pain. States over the last few months she has been experiencing chest pain and shortness of breath but has not been able to be evaluated due to lack of health insurance. Denies heavy periods. Denies  daily NSAIDs. Smokes cigars. Marijuana daily. Denies family history of colon cancer.  ED course: Vital signs in the ED notable for heart rate in the 100s to 110s.  Lab workup showed CMP with potassium 3.3, glucose 103, calcium 8.7, albumin 2.8.  CBC with hemoglobin 5.7..  MCV 64.  Platelets 456.  FOBT positive.  Notably did have hemorrhoids on rectal exam per EDP.  2 units ordered for transfusion in the ED.   Past Medical History:  Diagnosis Date   Anemia 06/04/2021   Hypertension     Surgical History:  She  has a past surgical history that includes Tubal ligation (04/09/2000). Family History:  Her family history includes Breast cancer in her mother; Diabetes in her mother. Social History:   reports that she has been smoking cigars. She does not have any smokeless tobacco history on file. She reports current drug use. Frequency: 6.00 times per week. Drug: Marijuana. She reports that she does not drink alcohol.  Prior to Admission medications   Medication Sig Start Date End Date Taking? Authorizing Provider  pantoprazole (PROTONIX) 40 MG tablet Take 1 tablet (40 mg total) by mouth daily. 05/12/22  Yes Grayce Sessions, NP  loperamide (IMODIUM) 2 MG capsule Take by mouth as needed for diarrhea or loose stools. Patient not taking: Reported on 05/23/2022    [provider]  sucralfate (CARAFATE) 1 g tablet Take 1 tablet (1 g total) by mouth 4 (four) times daily -  with meals and at bedtime. Patient not taking: Reported on 05/23/2022 05/19/21   Georganna Skeans, MD    Current Facility-Administered Medications  Medication Dose Route Frequency Provider Last Rate Last Admin   0.9 %  sodium chloride infusion (Manually program via Guardrails IV Fluids)   Intravenous Once Synetta Fail, MD   Held at 05/23/22 1302   acetaminophen (TYLENOL) tablet 650 mg  650 mg Oral Q6H PRN Synetta Fail, MD       Or   acetaminophen (TYLENOL) suppository 650 mg  650 mg Rectal Q6H PRN Synetta Fail, MD       polyethylene glycol (MIRALAX / GLYCOLAX) packet 17 g  17 g Oral Daily PRN Synetta Fail, MD       sodium chloride flush (NS) 0.9 % injection 3 mL  3 mL Intravenous Q12H Synetta Fail, MD   3 mL at 05/23/22 1302   Current Outpatient Medications  Medication Sig Dispense Refill   pantoprazole (PROTONIX) 40 MG tablet Take 1 tablet (40 mg total) by mouth daily. 30 tablet 2   loperamide (IMODIUM) 2 MG capsule Take by mouth as needed for diarrhea or loose stools. (Patient not taking: Reported on 05/23/2022)     sucralfate (CARAFATE) 1 g tablet Take 1 tablet (1 g total) by mouth 4 (four) times daily -  with meals and at bedtime. (Patient not taking: Reported on 05/23/2022) 60 tablet 3    Allergies as of 05/23/2022 - Review Complete 05/23/2022  Allergen Reaction Noted   Morphine and related Itching 06/01/2012    Review of Systems  Constitutional:  Positive for malaise/fatigue and weight loss. Negative for chills and fever.  HENT:  Negative for hearing loss and  tinnitus.   Eyes:  Negative for blurred vision and double vision.  Respiratory:  Positive for shortness of breath. Negative for cough.   Cardiovascular:  Positive for chest pain. Negative for palpitations.  Gastrointestinal:  Positive for abdominal pain. Negative for blood in stool, constipation, diarrhea, heartburn, melena, nausea and vomiting.  Genitourinary:  Negative for dysuria and urgency.  Musculoskeletal:  Negative for myalgias.  Skin:  Negative for itching and rash.  Neurological:  Negative for seizures and loss of consciousness.  Psychiatric/Behavioral:  Negative for depression and suicidal ideas.        Physical Exam:  Vital signs in last 24 hours: Temp:  [98.5 F (36.9 C)] 98.5 F (36.9 C) (04/29 1026) Pulse Rate:  [99-114] 99 (04/29 1300) Resp:  [16-21] 20 (04/29 1300) BP: (121-134)/(81-86) 134/83 (04/29 1230) SpO2:  [100 %] 100 % (04/29 1300) Weight:  [59 kg] 59 kg (04/29 1027)    Last BM recorded by nurses in past 5 days No data recorded  Physical Exam Constitutional:      Appearance: Normal appearance. She is normal weight.  HENT:     Head: Normocephalic.     Nose: Nose normal. No congestion.  Eyes:     Extraocular Movements: Extraocular movements intact.     Comments: Pale conjunctiva  Cardiovascular:     Rate and Rhythm: Normal rate and regular rhythm.  Pulmonary:     Effort: Pulmonary effort is normal. No respiratory distress.  Abdominal:     General: Abdomen is flat. Bowel sounds are normal. There is no distension.     Palpations: Abdomen is soft. There is no mass.     Tenderness: There is abdominal tenderness (mild tenderness left side). There is no guarding or rebound.     Hernia: No hernia is present.  Musculoskeletal:        General: No swelling. Normal range of motion.     Cervical back: Normal range of motion and neck supple.  Skin:    General: Skin is warm and dry.     Coloration: Skin is not jaundiced.  Neurological:     General: No focal deficit present.     Mental Status: She is oriented to person, place, and time.  Psychiatric:        Mood and Affect: Mood normal.        Behavior: Behavior normal.        Thought Content: Thought content normal.        Judgment: Judgment normal.      LAB RESULTS: Recent Labs    05/23/22 1031  WBC 5.9  HGB 5.7*  HCT 22.0*  PLT 456*   BMET Recent Labs    05/23/22 1031  NA 137  K 3.3*  CL 105  CO2 25  GLUCOSE 103*  BUN 6  CREATININE 0.80  CALCIUM 8.7*   LFT Recent Labs    05/23/22 1031  PROT 6.5  ALBUMIN 2.8*  AST 20  ALT 12  ALKPHOS 60  BILITOT 0.5   PT/INR No results for input(s): "LABPROT", "INR" in the last 72 hours.  STUDIES: No results found.    Impression    Symptomatic Anemia, FOBT positive, weight loss - hgb 5.7, 2 units pRBCs ordered - BUN 6, cr. 0.80 - Iron studies pending Less likely concerned for upper GI bleed without overt bleeding and normal BUN,  though cannot rule out. Given multiple symptoms including weight loss, imaging recommended.  Acute on chronic abdominal pain - CT ab pelvis  with contrast ordered - normal LFTs Possibly gastritis versus PUD since initially improved with pantoprazole and pain worse with eating.   Thrombocytosis - platelets 456 Possibly reactive in the setting of iron deficiency anemia   Plan   - Await iron studies - Await CT ab/pelvis results - Protonix IV 40mg  BID, can consider adding on carafate if no improvement in pain and CT is negative. - pending CT results, will likely undergo EGD/colonoscopy during admission for further evaluation. Possibly tomorrow versus Wednesday. Will need hgb >7 for procedure. - Continue daily CBC and transfuse as needed to maintain HGB > 7   Thank you for your kind consultation, we will continue to follow.  Bayley Leanna Sato  05/23/2022, 1:39 PM

## 2022-05-23 NOTE — ED Notes (Signed)
Pt ambulated to restroom with no complaints.  

## 2022-05-23 NOTE — ED Provider Notes (Signed)
[facility] Provider Note  CSN: [id number] Arrival date & time: 05/23/22 1013  Chief Complaint(s) Abnormal Lab  HPI Dawn Walsh is a 47 y.o. female with PMH anemia, HTN who presents emergency department for evaluation of shortness of breath, weakness and abnormal labs.  Patient states that over the last 2 months she has become progressively more short of breath with weakness.  She states symptoms worse over the last 1 week and went to a new primary care physician 3 days ago where routine lab work was performed.  Patient found to be significantly anemic with hemoglobin of 5.9 and patient informed he needs to come to the emergency department for further evaluation.  Here in the emergency room, patient endorses some mild shortness of breath and fatigue but denies dark or bloody stools, vomiting, abdominal pain, nausea, headache, fever or other systemic symptoms.  Denies heavy vaginal bleeding.   Past Medical History Past Medical History:  Diagnosis Date   Anemia 06/04/2021   Hypertension    Patient Active Problem List   Diagnosis Date Noted   Symptomatic anemia 05/23/2022   Abdominal pain 05/23/2022   Nausea and vomiting 05/23/2022   Unintentional weight loss 05/23/2022   Anemia 06/04/2021   LLQ pain 06/04/2021   Home Medication(s) Prior to Admission medications   Medication Sig Start Date End Date Taking? Authorizing Provider  pantoprazole (PROTONIX) 40 MG tablet Take 1 tablet (40 mg total) by mouth daily. 05/12/22  Yes Grayce Sessions, NP                                                                                                                                    Past Surgical History Past Surgical History:  Procedure Laterality Date   TUBAL LIGATION  04/09/2000   Family History Family History  Problem Relation Age of Onset   Diabetes Mother    Breast cancer Mother    Colon cancer Neg Hx    Stomach cancer Neg Hx     Social  History Social History   Tobacco Use   Smoking status: Some Days    Types: Cigars  Vaping Use   Vaping Use: Never used  Substance Use Topics   Alcohol use: No   Drug use: Yes    Frequency: 6.0 times per week    Types: Marijuana    Comment: every other day   Allergies Morphine and related  Review of Systems Review of Systems  Constitutional:  Positive for fatigue.  Respiratory:  Positive for shortness of breath.    \ Physical Exam Vital Signs  I have reviewed the triage vital signs BP (!) 140/89   Pulse 87   Temp 98.3 F (36.8 C) (Oral)   Resp 18   Ht 5\' 8"  (1.727 m)   Wt 59 kg   SpO2 100%   BMI 19.77 kg/m   Physical Exam Vitals and nursing note  reviewed.  Constitutional:      General: She is not in acute distress.    Appearance: She is well-developed.  HENT:     Head: Normocephalic and atraumatic.  Eyes:     Conjunctiva/sclera: Conjunctivae normal.  Cardiovascular:     Rate and Rhythm: Normal rate and regular rhythm.     Heart sounds: No murmur heard. Pulmonary:     Effort: Pulmonary effort is normal. No respiratory distress.     Breath sounds: Normal breath sounds.  Abdominal:     Palpations: Abdomen is soft.     Tenderness: There is no abdominal tenderness.  Genitourinary:    Comments: Large external hemorrhoid Musculoskeletal:        General: No swelling.     Cervical back: Neck supple.  Skin:    General: Skin is warm and dry.     Capillary Refill: Capillary refill takes less than 2 seconds.     Coloration: Skin is pale.  Neurological:     Mental Status: She is alert.  Psychiatric:        Mood and Affect: Mood normal.     ED Results and Treatments Labs (all labs ordered are listed, but only abnormal results are displayed) Labs Reviewed  COMPREHENSIVE METABOLIC PANEL - Abnormal; Notable for the following components:      Result Value   Potassium 3.3 (*)    Glucose, Bld 103 (*)    Calcium 8.7 (*)    Albumin 2.8 (*)    All other  components within normal limits  CBC WITH DIFFERENTIAL/PLATELET - Abnormal; Notable for the following components:   RBC 3.40 (*)    Hemoglobin 5.7 (*)    HCT 22.0 (*)    MCV 64.7 (*)    MCH 16.8 (*)    MCHC 25.9 (*)    RDW 19.8 (*)    Platelets 456 (*)    All other components within normal limits  IRON AND TIBC - Abnormal; Notable for the following components:   Iron 12 (*)    TIBC 592 (*)    Saturation Ratios 2 (*)    All other components within normal limits  FERRITIN - Abnormal; Notable for the following components:   Ferritin 1 (*)    All other components within normal limits  RETICULOCYTES - Abnormal; Notable for the following components:   RBC. 2.74 (*)    Immature Retic Fract 16.8 (*)    All other components within normal limits  POC OCCULT BLOOD, ED - Abnormal; Notable for the following components:   Fecal Occult Bld POSITIVE (*)    All other components within normal limits  MAGNESIUM  BASIC METABOLIC PANEL  CBC  I-STAT BETA HCG BLOOD, ED ([facility], [facility], [facility] ONLY)  TYPE AND SCREEN  PREPARE RBC (CROSSMATCH)  ABO/RH  Radiology No results found.  Pertinent labs & imaging results that were available during my care of the patient were reviewed by me and considered in my medical decision making (see MDM for details).  Medications Ordered in ED Medications  0.9 %  sodium chloride infusion (Manually program via Guardrails IV Fluids) (0 mLs Intravenous Hold 05/23/22 1302)  sodium chloride flush (NS) 0.9 % injection 3 mL (3 mLs Intravenous Given 05/23/22 1302)  acetaminophen (TYLENOL) tablet 650 mg (has no administration in time range)    Or  acetaminophen (TYLENOL) suppository 650 mg (has no administration in time range)  polyethylene glycol (MIRALAX / GLYCOLAX) packet 17 g (has no administration in time range)  pantoprazole (PROTONIX) injection 40 mg (40 mg  Intravenous Not Given 05/23/22 1632)  peg 3350 powder (MOVIPREP) kit 100 g (has no administration in time range)    And  peg 3350 powder (MOVIPREP) kit 100 g (has no administration in time range)  HYDROcodone-acetaminophen (NORCO/VICODIN) 5-325 MG per tablet 1 tablet (1 tablet Oral Given 05/23/22 1709)                                                                                                                                     Procedures .Critical Care  Performed by: Glendora Score, MD Authorized by: Glendora Score, MD   Critical care provider statement:    Critical care time (minutes):  30   Critical care was necessary to treat or prevent imminent or life-threatening deterioration of the following conditions: Critical anemia requiring blood transfusion.   Critical care was time spent personally by me on the following activities:  Development of treatment plan with patient or surrogate, discussions with consultants, evaluation of patient's response to treatment, examination of patient, ordering and review of laboratory studies, ordering and review of radiographic studies, ordering and performing treatments and interventions, pulse oximetry, re-evaluation of patient's condition and review of old charts   (including critical care time)  Medical Decision Making / ED Course   This patient presents to the ED for concern of fatigue, shortness of breath, abnormal labs, this involves an extensive number of treatment options, and is a complaint that carries with it a high risk of complications and morbidity.  The differential diagnosis includes iron deficiency anemia, anemia of chronic disease, acute blood loss anemia, thalassemia, lower GI bleed, upper GI bleed  MDM: Patient seen emergency room for evaluation of fatigue, shortness of breath and abnormal labs.  Physical exam with skin pallor and a large external hemorrhoid but patient initially Hemoccult negative.  Initial CBC with a hemoglobin  of 5.7 with an MCV of 64.7.  2 units packed red blood cells ordered and administered.  Iron studies sent prior to packed red blood cell initiation.  Given symptomatic anemia of unknown origin, patient will require hospital admission for further workup.  Patient then admitted.   Additional history obtained:  -External records from outside source  obtained and reviewed including: Chart review including previous notes, labs, imaging, consultation notes   Lab Tests: -I ordered, reviewed, and interpreted labs.   The pertinent results include:   Labs Reviewed  COMPREHENSIVE METABOLIC PANEL - Abnormal; Notable for the following components:      Result Value   Potassium 3.3 (*)    Glucose, Bld 103 (*)    Calcium 8.7 (*)    Albumin 2.8 (*)    All other components within normal limits  CBC WITH DIFFERENTIAL/PLATELET - Abnormal; Notable for the following components:   RBC 3.40 (*)    Hemoglobin 5.7 (*)    HCT 22.0 (*)    MCV 64.7 (*)    MCH 16.8 (*)    MCHC 25.9 (*)    RDW 19.8 (*)    Platelets 456 (*)    All other components within normal limits  IRON AND TIBC - Abnormal; Notable for the following components:   Iron 12 (*)    TIBC 592 (*)    Saturation Ratios 2 (*)    All other components within normal limits  FERRITIN - Abnormal; Notable for the following components:   Ferritin 1 (*)    All other components within normal limits  RETICULOCYTES - Abnormal; Notable for the following components:   RBC. 2.74 (*)    Immature Retic Fract 16.8 (*)    All other components within normal limits  POC OCCULT BLOOD, ED - Abnormal; Notable for the following components:   Fecal Occult Bld POSITIVE (*)    All other components within normal limits  MAGNESIUM  BASIC METABOLIC PANEL  CBC  I-STAT BETA HCG BLOOD, ED ([facility], [facility], [facility] ONLY)  TYPE AND SCREEN  PREPARE RBC (CROSSMATCH)  ABO/RH       Medicines ordered and prescription drug management: Meds ordered this encounter  Medications   0.9 %   sodium chloride infusion (Manually program via Guardrails IV Fluids)   sodium chloride flush (NS) 0.9 % injection 3 mL   OR Linked Order Group    acetaminophen (TYLENOL) tablet 650 mg    acetaminophen (TYLENOL) suppository 650 mg   polyethylene glycol (MIRALAX / GLYCOLAX) packet 17 g   DISCONTD: pantoprazole (PROTONIX) EC tablet 40 mg   pantoprazole (PROTONIX) injection 40 mg   DISCONTD: peg 3350 powder (MOVIPREP) kit 200 g   AND Linked Order Group    peg 3350 powder (MOVIPREP) kit 100 g    peg 3350 powder (MOVIPREP) kit 100 g   HYDROcodone-acetaminophen (NORCO/VICODIN) 5-325 MG per tablet 1 tablet    -I have reviewed the patients home medicines and have made adjustments as needed  Critical interventions Packed red blood cell initiation    Cardiac Monitoring: The patient was maintained on a cardiac monitor.  I personally viewed and interpreted the cardiac monitored which showed an underlying rhythm of: NSR  Social Determinants of Health:  Factors impacting patients care include: none   Reevaluation: After the interventions noted above, I reevaluated the patient and found that they have :stayed the same  Co morbidities that complicate the patient evaluation  Past Medical History:  Diagnosis Date   Anemia 06/04/2021   Hypertension       Dispostion: I considered admission for this patient, and due to critical anemia requiring blood transfusion patient will require hospital admission for further workup     Final Clinical Impression(s) / ED Diagnoses Final diagnoses:  Symptomatic anemia     @PCDICTATION @    Glendora Score, MD 05/23/22  1912  

## 2022-05-23 NOTE — ED Triage Notes (Signed)
Pt arrived POV from home stating her PCP called because she had blood work done Friday and her hemoglobin is low. Pt endorses SHOB and weakness.

## 2022-05-23 NOTE — H&P (View-Only) (Signed)
   Attending physician's note   I have taken a history, reviewed the chart, and examined the patient. I performed a substantive portion of this encounter, including complete performance of at least one of the key components, in conjunction with the APP. I agree with the APP's note, impression, and recommendations with my edits.   47-year-old female with medical history as outlined below, presents with acute on chronic anemia, symptomatic anemia, heme positive stool, in the setting of left-sided abdominal pain and intermittent nausea/vomiting over the last year with associated 50# wt loss.  Was seen in the GI clinic for the abdominal pain, nausea/vomiting (along with diarrhea at that time) on 06/04/2021 it was noted to have microcytic anemia, iron deficiency, normal B12/folate, and recommended EGD/colonoscopy and CT, which were never completed by the patient.  Admission evaluation today notable for the following: - H/H 5.7/22, MCV/RDW 65/20 - K3.3, otherwise normal BMP - Albumin 2.8, otherwise normal liver enzymes - FOBT positive  Exam with pinpoint tenderness in the left mid abdomen, positive Carnett's sign.  1) Iron deficiency anemia 2) Symptomatic anemia 3) Abdominal pain 4) Unintentional weight loss 5) Nausea/vomiting - CT abdomen/pelvis today - High-dose PPI as outlined - Plan for EGD/colonoscopy on this admission - Clears today and bowel prep this evening in anticipation of EGD/colonoscopy tomorrow - RBC transfusion - Posttransfusion CBC check with additional blood products as needed per protocol  The indications, risks, and benefits of EGD and colonoscopy were explained to the patient in detail. Risks include but are not limited to bleeding, perforation, adverse reaction to medications, and cardiopulmonary compromise. Sequelae include but are not limited to the possibility of surgery, hospitalization, and mortality. The patient verbalized understanding and wished to proceed.  Further recommendations pending results of the exam.    Dajha Urquilla, DO, FACG [phone] office          Consultation  Referring Provider:  [facility]  Primary Care Physician:  Edwards, Michelle P, NP Primary Gastroenterologist:  [facility] - Colleen Kennedy-Smith, NP       Reason for Consultation:    acute on chronic anemia, abdominal pain, weight loss   LOS: 0 days          HPI:   Dawn Walsh is a 47 y.o. female with past medical history significant for anemia, hypertension, s/p tubal ligation 03/2000, presents for evaluation of acute on chronic anemia, abdominal pain, and weight loss.  Seen in office 05/2021 by Colleen Kennedy-Smith, NP for weight loss and left-sided abdominal pain.  Hemoglobin 7.7 05/2021. Prior to that, hgb was 11.6 in 2014. No other records of CBC until PCP visit 3 days ago.  Patient states she has been without health insurance and has not been able to follow through with GI recommendations of CT scan and EGD. Recently was able to get medicaid and made appt with PCP. CBC with hgb of 5.9 at PCP. Patient subsequently recommended to go to ED. On admission hemoglobin 5.7.  BUN 6, creatinine 0.80. CT ab pelvis with contrast ordered.  Patient states she has had continued left sided pain mid to upper quadrant abdominal pain for the last year becoming progressively worse.  Exacerbated with eating and will wake her up at night. Intermittent. Will have to take tylenol PM to go to sleep due to pain. Denies melena/hematochezia. Reports some intermittent nausea, no vomiting. Patient was prescribed pantoprazole and this helped her pain in the past, no longer working at this time.  States she has lost 50   lbs over the course of one year unintentionally due to decreased caloric intake from pain. States over the last few months she has been experiencing chest pain and shortness of breath but has not been able to be evaluated due to lack of health insurance. Denies heavy periods. Denies  daily NSAIDs. Smokes cigars. Marijuana daily. Denies family history of colon cancer.  ED course: Vital signs in the ED notable for heart rate in the 100s to 110s.  Lab workup showed CMP with potassium 3.3, glucose 103, calcium 8.7, albumin 2.8.  CBC with hemoglobin 5.7..  MCV 64.  Platelets 456.  FOBT positive.  Notably did have hemorrhoids on rectal exam per EDP.  2 units ordered for transfusion in the ED.   Past Medical History:  Diagnosis Date   Anemia 06/04/2021   Hypertension     Surgical History:  She  has a past surgical history that includes Tubal ligation (04/09/2000). Family History:  Her family history includes Breast cancer in her mother; Diabetes in her mother. Social History:   reports that she has been smoking cigars. She does not have any smokeless tobacco history on file. She reports current drug use. Frequency: 6.00 times per week. Drug: Marijuana. She reports that she does not drink alcohol.  Prior to Admission medications   Medication Sig Start Date End Date Taking? Authorizing Provider  pantoprazole (PROTONIX) 40 MG tablet Take 1 tablet (40 mg total) by mouth daily. 05/12/22  Yes Edwards, Michelle P, NP  loperamide (IMODIUM) 2 MG capsule Take by mouth as needed for diarrhea or loose stools. Patient not taking: Reported on 05/23/2022    [provider]  sucralfate (CARAFATE) 1 g tablet Take 1 tablet (1 g total) by mouth 4 (four) times daily -  with meals and at bedtime. Patient not taking: Reported on 05/23/2022 05/19/21   Wilson, Amelia, MD    Current Facility-Administered Medications  Medication Dose Route Frequency Provider Last Rate Last Admin   0.9 %  sodium chloride infusion (Manually program via Guardrails IV Fluids)   Intravenous Once Melvin, Alexander B, MD   Held at 05/23/22 1302   acetaminophen (TYLENOL) tablet 650 mg  650 mg Oral Q6H PRN Melvin, Alexander B, MD       Or   acetaminophen (TYLENOL) suppository 650 mg  650 mg Rectal Q6H PRN Melvin,  Alexander B, MD       polyethylene glycol (MIRALAX / GLYCOLAX) packet 17 g  17 g Oral Daily PRN Melvin, Alexander B, MD       sodium chloride flush (NS) 0.9 % injection 3 mL  3 mL Intravenous Q12H Melvin, Alexander B, MD   3 mL at 05/23/22 1302   Current Outpatient Medications  Medication Sig Dispense Refill   pantoprazole (PROTONIX) 40 MG tablet Take 1 tablet (40 mg total) by mouth daily. 30 tablet 2   loperamide (IMODIUM) 2 MG capsule Take by mouth as needed for diarrhea or loose stools. (Patient not taking: Reported on 05/23/2022)     sucralfate (CARAFATE) 1 g tablet Take 1 tablet (1 g total) by mouth 4 (four) times daily -  with meals and at bedtime. (Patient not taking: Reported on 05/23/2022) 60 tablet 3    Allergies as of 05/23/2022 - Review Complete 05/23/2022  Allergen Reaction Noted   Morphine and related Itching 06/01/2012    Review of Systems  Constitutional:  Positive for malaise/fatigue and weight loss. Negative for chills and fever.  HENT:  Negative for hearing loss and   tinnitus.   Eyes:  Negative for blurred vision and double vision.  Respiratory:  Positive for shortness of breath. Negative for cough.   Cardiovascular:  Positive for chest pain. Negative for palpitations.  Gastrointestinal:  Positive for abdominal pain. Negative for blood in stool, constipation, diarrhea, heartburn, melena, nausea and vomiting.  Genitourinary:  Negative for dysuria and urgency.  Musculoskeletal:  Negative for myalgias.  Skin:  Negative for itching and rash.  Neurological:  Negative for seizures and loss of consciousness.  Psychiatric/Behavioral:  Negative for depression and suicidal ideas.        Physical Exam:  Vital signs in last 24 hours: Temp:  [98.5 F (36.9 C)] 98.5 F (36.9 C) (04/29 1026) Pulse Rate:  [99-114] 99 (04/29 1300) Resp:  [16-21] 20 (04/29 1300) BP: (121-134)/(81-86) 134/83 (04/29 1230) SpO2:  [100 %] 100 % (04/29 1300) Weight:  [59 kg] 59 kg (04/29 1027)    Last BM recorded by nurses in past 5 days No data recorded  Physical Exam Constitutional:      Appearance: Normal appearance. She is normal weight.  HENT:     Head: Normocephalic.     Nose: Nose normal. No congestion.  Eyes:     Extraocular Movements: Extraocular movements intact.     Comments: Pale conjunctiva  Cardiovascular:     Rate and Rhythm: Normal rate and regular rhythm.  Pulmonary:     Effort: Pulmonary effort is normal. No respiratory distress.  Abdominal:     General: Abdomen is flat. Bowel sounds are normal. There is no distension.     Palpations: Abdomen is soft. There is no mass.     Tenderness: There is abdominal tenderness (mild tenderness left side). There is no guarding or rebound.     Hernia: No hernia is present.  Musculoskeletal:        General: No swelling. Normal range of motion.     Cervical back: Normal range of motion and neck supple.  Skin:    General: Skin is warm and dry.     Coloration: Skin is not jaundiced.  Neurological:     General: No focal deficit present.     Mental Status: She is oriented to person, place, and time.  Psychiatric:        Mood and Affect: Mood normal.        Behavior: Behavior normal.        Thought Content: Thought content normal.        Judgment: Judgment normal.      LAB RESULTS: Recent Labs    05/23/22 1031  WBC 5.9  HGB 5.7*  HCT 22.0*  PLT 456*   BMET Recent Labs    05/23/22 1031  NA 137  K 3.3*  CL 105  CO2 25  GLUCOSE 103*  BUN 6  CREATININE 0.80  CALCIUM 8.7*   LFT Recent Labs    05/23/22 1031  PROT 6.5  ALBUMIN 2.8*  AST 20  ALT 12  ALKPHOS 60  BILITOT 0.5   PT/INR No results for input(s): "LABPROT", "INR" in the last 72 hours.  STUDIES: No results found.    Impression    Symptomatic Anemia, FOBT positive, weight loss - hgb 5.7, 2 units pRBCs ordered - BUN 6, cr. 0.80 - Iron studies pending Less likely concerned for upper GI bleed without overt bleeding and normal BUN,  though cannot rule out. Given multiple symptoms including weight loss, imaging recommended.  Acute on chronic abdominal pain - CT ab pelvis   with contrast ordered - normal LFTs Possibly gastritis versus PUD since initially improved with pantoprazole and pain worse with eating.   Thrombocytosis - platelets 456 Possibly reactive in the setting of iron deficiency anemia   Plan   - Await iron studies - Await CT ab/pelvis results - Protonix IV 40mg BID, can consider adding on carafate if no improvement in pain and CT is negative. - pending CT results, will likely undergo EGD/colonoscopy during admission for further evaluation. Possibly tomorrow versus Wednesday. Will need hgb >7 for procedure. - Continue daily CBC and transfuse as needed to maintain HGB > 7   Thank you for your kind consultation, we will continue to follow.  Bayley M McMichael  05/23/2022, 1:39 PM  

## 2022-05-23 NOTE — H&P (Signed)
History and Physical   Dawn Walsh ZOX:[id number] DOB: Jul 24, 1975 DOA: 05/23/2022  PCP: Dawn Sessions, NP   Patient coming from: Home  Chief Complaint: Shortness of breath, weakness, abnormal lab  HPI: Dawn Walsh is a 47 y.o. female with medical history significant of anemia, hypertension presenting with shortness of breath, weakness, abnormal labs at PCP office.  Patient has been experiencing shortness of breath and weakness for the past 2 months.  Went to new PCP 3 days ago labs were checked and patient called today told her hemoglobin was 5.9 and asked to go to the ED for further evaluation.  She denies dark or bloody stools states they are somewhat pale.  Denies any heavy vaginal bleeding.  She is had about a year of left-sided abdominal pain and seen previous primary care provider and GI provider once outpatient for this.  Endoscopy and CT of the abdomen pelvis was recommended but she never got these done. Appears she was lost to follow-up.  Has some history of nausea and vomiting as well.  Continues to have worsening abdominal pain with eating.  Is taking her PPI daily.  Continues to have decreased appetite. Denies fevers, chills, chest pain, constipation, diarrhea.  ED Course: Vital signs in the ED notable for heart rate in the 100s to 110s.  Lab workup showed CMP with potassium 3.3, glucose 103, calcium 8.7, albumin 2.8.  CBC with hemoglobin 5.7 down from 5.93 days ago and eight 1 year ago.  MCV 64.  Platelets 456.  FOBT positive.  Notably did have hemorrhoids on rectal exam per EDP.  2 units ordered for transfusion in the ED.  Review of Systems: As per HPI otherwise all other systems reviewed and are negative.  Past Medical History:  Diagnosis Date   Anemia 06/04/2021   Hypertension     Past Surgical History:  Procedure Laterality Date   TUBAL LIGATION  04/09/2000    Social History  reports that she has been smoking cigars. She does not have any smokeless  tobacco history on file. She reports current drug use. Frequency: 6.00 times per week. Drug: Marijuana. She reports that she does not drink alcohol.  Allergies  Allergen Reactions   Morphine And Related Itching    Family History  Problem Relation Age of Onset   Diabetes Mother    Breast cancer Mother    Colon cancer Neg Hx    Stomach cancer Neg Hx   Reviewed on admission  Prior to Admission medications   Medication Sig Start Date End Date Taking? Authorizing Provider  pantoprazole (PROTONIX) 40 MG tablet Take 1 tablet (40 mg total) by mouth daily. 05/12/22  Yes Dawn Sessions, NP  loperamide (IMODIUM) 2 MG capsule Take by mouth as needed for diarrhea or loose stools. Patient not taking: Reported on 05/23/2022    [provider]  sucralfate (CARAFATE) 1 g tablet Take 1 tablet (1 g total) by mouth 4 (four) times daily -  with meals and at bedtime. Patient not taking: Reported on 05/23/2022 05/19/21   Georganna Skeans, MD    Physical Exam: Vitals:   05/23/22 1027 05/23/22 1221 05/23/22 1230 05/23/22 1300  BP:  121/81 134/83   Pulse:  (!) 101 100 99  Resp:  20 (!) 21 20  Temp:      SpO2:  100% 100% 100%  Weight: 59 kg     Height: 5\' 8"  (1.727 m)       Physical Exam Constitutional:  General: She is not in acute distress.    Appearance: Normal appearance.  HENT:     Head: Normocephalic and atraumatic.     Mouth/Throat:     Mouth: Mucous membranes are moist.     Pharynx: Oropharynx is clear.  Eyes:     Extraocular Movements: Extraocular movements intact.     Pupils: Pupils are equal, round, and reactive to light.  Cardiovascular:     Rate and Rhythm: Regular rhythm. Tachycardia present.     Pulses: Normal pulses.     Heart sounds: Normal heart sounds.  Pulmonary:     Effort: Pulmonary effort is normal. No respiratory distress.     Breath sounds: Normal breath sounds.  Abdominal:     General: Bowel sounds are normal. There is no distension.     Palpations:  Abdomen is soft.     Tenderness: There is no abdominal tenderness.  Musculoskeletal:        General: No swelling or deformity.  Skin:    General: Skin is warm and dry.  Neurological:     General: No focal deficit present.     Mental Status: Mental status is at baseline.    Labs on Admission: I have personally reviewed following labs and imaging studies  CBC: Recent Labs  Lab 05/20/22 0852 05/23/22 1031  WBC 5.9 5.9  NEUTROABS 4.0 4.3  HGB 5.9* 5.7*  HCT 23.1* 22.0*  MCV 63* 64.7*  PLT 463* 456*    Basic Metabolic Panel: Recent Labs  Lab 05/23/22 1031  NA 137  K 3.3*  CL 105  CO2 25  GLUCOSE 103*  BUN 6  CREATININE 0.80  CALCIUM 8.7*    GFR: Estimated Creatinine Clearance: 83.6 mL/min (by C-G formula based on SCr of 0.8 mg/dL).  Liver Function Tests: Recent Labs  Lab 05/23/22 1031  AST 20  ALT 12  ALKPHOS 60  BILITOT 0.5  PROT 6.5  ALBUMIN 2.8*    Urine analysis:    Component Value Date/Time   COLORURINE YELLOW 06/01/2012 1203   APPEARANCEUR HAZY (A) 06/01/2012 1203   LABSPEC >=1.030 03/03/2022 1252   PHURINE 6.5 03/03/2022 1252   GLUCOSEU NEGATIVE 03/03/2022 1252   HGBUR TRACE (A) 03/03/2022 1252   BILIRUBINUR NEGATIVE 03/03/2022 1252   KETONESUR NEGATIVE 03/03/2022 1252   PROTEINUR 30 (A) 03/03/2022 1252   UROBILINOGEN 0.2 03/03/2022 1252   NITRITE NEGATIVE 03/03/2022 1252   LEUKOCYTESUR SMALL (A) 03/03/2022 1252    Radiological Exams on Admission: No results found.  EKG: Not performed in the emergency department.  Assessment/Plan Principal Problem:   Symptomatic anemia Active Problems:   Abdominal pain   Symptomatic anemia > Known history of prior anemia with hemoglobin of 8 a year ago.   > Has been experiencing shortness of breath and weakness for the past 1 to 2 months.  Went to PCP office 3 days ago and labs there showed hemoglobin 5.9.  Confirmed at 5.7 here.  > > Does not report black or bloody stools.  FOBT negative but  hemorrhoids noted on exam by EDP.  No heavy menses. > MCV 64.  Suspect iron deficiency anemia with intermittent bleeding both rectal and vaginal.  However, with other long-term issues as below will get CT and discussed with GI. > 2 units IV pRBCs ordered in ED. - Monitor on telemetry  - GI consult - Continue with 2 units transfusion - Trend hemoglobin - Follow-up iron, ferritin, reticulocytes - Consider iron transfusion  Abdominal pain Presumed gastritis >  Pain worsened with eating is located on the left mid to upper quadrant.  Presumed gastritis has not yet had endoscopy/CT performed which was recommended a year ago when she saw GI. > Has been taking a PPI. > Reports total of 50 pounds weight loss without trying due to decrease p.o. intake,  due to pain and some history of nausea vomiting. - GI consult - CT abdomen pelvis with contrast - Continue with PPI  DVT prophylaxis: SCDs for now Code Status:   Full Family Communication:  None on admission  Disposition Plan:   Patient is from:  Home  Anticipated DC to:  Home  Anticipated DC date:  1 to 2 days  Anticipated DC barriers: None  Consults called:  None Admission status:  Observation, telemetry  Severity of Illness: The appropriate patient status for this patient is OBSERVATION. Observation status is judged to be reasonable and necessary in order to provide the required intensity of service to ensure the patient's safety. The patient's presenting symptoms, physical exam findings, and initial radiographic and laboratory data in the context of their medical condition is felt to place them at decreased risk for further clinical deterioration. Furthermore, it is anticipated that the patient will be medically stable for discharge from the hospital within 2 midnights of admission.   Synetta Fail MD [facility]  How to contact the [facility] Attending or Consulting provider 7A - 7P or covering provider during after hours 7P -7A,  for this patient?   Check the care team in [facility] and look for a) attending/consulting [facility] provider listed and b) the [facility] team listed Log into [email or web address] and use [place]'s universal password to access. If you do not have the password, please contact the hospital operator. Locate the [facility] provider you are looking for under [facility] and page to a number that you can be directly reached. If you still have difficulty reaching the provider, please page the [facility] (Director on Call) for the Left upper quadrantHospitalists listed on amion for assistance.  05/23/2022, 1:43 PM

## 2022-05-24 ENCOUNTER — Encounter (HOSPITAL_COMMUNITY): Payer: Self-pay | Admitting: Internal Medicine

## 2022-05-24 ENCOUNTER — Observation Stay (HOSPITAL_COMMUNITY): Payer: BLUE CROSS/BLUE SHIELD | Admitting: Certified Registered Nurse Anesthetist

## 2022-05-24 ENCOUNTER — Encounter (HOSPITAL_COMMUNITY)
Admission: EM | Disposition: A | Discharge status: Home / Self Care | Payer: Self-pay | Source: Home / Self Care | Attending: Student

## 2022-05-24 DIAGNOSIS — D509 Iron deficiency anemia, unspecified: Secondary | ICD-10-CM | POA: Diagnosis not present

## 2022-05-24 DIAGNOSIS — F1210 Cannabis abuse, uncomplicated: Secondary | ICD-10-CM | POA: Diagnosis present

## 2022-05-24 DIAGNOSIS — K6289 Other specified diseases of anus and rectum: Secondary | ICD-10-CM

## 2022-05-24 DIAGNOSIS — K633 Ulcer of intestine: Secondary | ICD-10-CM

## 2022-05-24 DIAGNOSIS — Z681 Body mass index (BMI) 19 or less, adult: Secondary | ICD-10-CM | POA: Diagnosis not present

## 2022-05-24 DIAGNOSIS — R1032 Left lower quadrant pain: Secondary | ICD-10-CM | POA: Diagnosis present

## 2022-05-24 DIAGNOSIS — K2950 Unspecified chronic gastritis without bleeding: Secondary | ICD-10-CM

## 2022-05-24 DIAGNOSIS — D649 Anemia, unspecified: Secondary | ICD-10-CM | POA: Diagnosis not present

## 2022-05-24 DIAGNOSIS — K529 Noninfective gastroenteritis and colitis, unspecified: Secondary | ICD-10-CM | POA: Diagnosis not present

## 2022-05-24 DIAGNOSIS — K8020 Calculus of gallbladder without cholecystitis without obstruction: Secondary | ICD-10-CM | POA: Diagnosis present

## 2022-05-24 DIAGNOSIS — K648 Other hemorrhoids: Secondary | ICD-10-CM | POA: Diagnosis not present

## 2022-05-24 DIAGNOSIS — Z803 Family history of malignant neoplasm of breast: Secondary | ICD-10-CM | POA: Diagnosis not present

## 2022-05-24 DIAGNOSIS — Z9851 Tubal ligation status: Secondary | ICD-10-CM | POA: Diagnosis not present

## 2022-05-24 DIAGNOSIS — R5383 Other fatigue: Secondary | ICD-10-CM | POA: Diagnosis present

## 2022-05-24 DIAGNOSIS — K644 Residual hemorrhoidal skin tags: Secondary | ICD-10-CM | POA: Diagnosis present

## 2022-05-24 DIAGNOSIS — Z833 Family history of diabetes mellitus: Secondary | ICD-10-CM | POA: Diagnosis not present

## 2022-05-24 DIAGNOSIS — Z885 Allergy status to narcotic agent status: Secondary | ICD-10-CM | POA: Diagnosis not present

## 2022-05-24 DIAGNOSIS — R112 Nausea with vomiting, unspecified: Secondary | ICD-10-CM | POA: Diagnosis present

## 2022-05-24 DIAGNOSIS — R0602 Shortness of breath: Secondary | ICD-10-CM | POA: Diagnosis present

## 2022-05-24 DIAGNOSIS — Z597 Insufficient social insurance and welfare support: Secondary | ICD-10-CM | POA: Diagnosis not present

## 2022-05-24 DIAGNOSIS — R634 Abnormal weight loss: Secondary | ICD-10-CM | POA: Diagnosis present

## 2022-05-24 DIAGNOSIS — F17290 Nicotine dependence, other tobacco product, uncomplicated: Secondary | ICD-10-CM | POA: Diagnosis present

## 2022-05-24 DIAGNOSIS — K5090 Crohn's disease, unspecified, without complications: Secondary | ICD-10-CM | POA: Diagnosis present

## 2022-05-24 DIAGNOSIS — K2970 Gastritis, unspecified, without bleeding: Secondary | ICD-10-CM | POA: Diagnosis present

## 2022-05-24 DIAGNOSIS — I10 Essential (primary) hypertension: Secondary | ICD-10-CM | POA: Diagnosis present

## 2022-05-24 HISTORY — PX: ESOPHAGOGASTRODUODENOSCOPY: SHX5428

## 2022-05-24 HISTORY — PX: BIOPSY: SHX5522

## 2022-05-24 HISTORY — PX: COLONOSCOPY: SHX5424

## 2022-05-24 LAB — CBC
HCT: 26 % — ABNORMAL LOW (ref 36.0–46.0)
HCT: 26.2 % — ABNORMAL LOW (ref 36.0–46.0)
Hemoglobin: 7 g/dL — ABNORMAL LOW (ref 12.0–15.0)
Hemoglobin: 7.4 g/dL — ABNORMAL LOW (ref 12.0–15.0)
MCH: 18.1 pg — ABNORMAL LOW (ref 26.0–34.0)
MCH: 19.6 pg — ABNORMAL LOW (ref 26.0–34.0)
MCHC: 26.9 g/dL — ABNORMAL LOW (ref 30.0–36.0)
MCHC: 28.2 g/dL — ABNORMAL LOW (ref 30.0–36.0)
MCV: 67.4 fL — ABNORMAL LOW (ref 80.0–100.0)
MCV: 69.3 fL — ABNORMAL LOW (ref 80.0–100.0)
Platelets: 457 10*3/uL — ABNORMAL HIGH (ref 150–400)
Platelets: 384 10*3/uL (ref 150–400)
RBC: 3.86 MIL/uL — ABNORMAL LOW (ref 3.87–5.11)
RBC: 3.78 MIL/uL — ABNORMAL LOW (ref 3.87–5.11)
RDW: 21.3 % — ABNORMAL HIGH (ref 11.5–15.5)
RDW: 22.7 % — ABNORMAL HIGH (ref 11.5–15.5)
WBC: 5.7 10*3/uL (ref 4.0–10.5)
WBC: 6 10*3/uL (ref 4.0–10.5)
nRBC: 0.4 % — ABNORMAL HIGH (ref 0.0–0.2)
nRBC: 0 % (ref 0.0–0.2)

## 2022-05-24 LAB — BASIC METABOLIC PANEL
Anion gap: 9 (ref 5–15)
BUN: <5 mg/dL — ABNORMAL LOW (ref 6–20)
CO2: 22 mmol/L (ref 22–32)
Calcium: 9 mg/dL (ref 8.9–10.3)
Chloride: 108 mmol/L (ref 98–111)
Creatinine, Ser: 0.8 mg/dL (ref 0.44–1.00)
GFR, Estimated: >60 mL/min (ref >60)
Glucose, Bld: 79 mg/dL (ref 70–99)
Potassium: 3.5 mmol/L (ref 3.5–5.1)
Sodium: 139 mmol/L (ref 135–145)

## 2022-05-24 LAB — PREPARE RBC (CROSSMATCH)

## 2022-05-24 LAB — BPAM RBC
ISSUE DATE / TIME: 202404301014
Unit Type and Rh: 6200

## 2022-05-24 LAB — PROCALCITONIN: Procalcitonin: <0.1 ng/mL

## 2022-05-24 LAB — C-REACTIVE PROTEIN: CRP: <0.5 mg/dL (ref <1.0)

## 2022-05-24 SURGERY — COLONOSCOPY
Anesthesia: Monitor Anesthesia Care

## 2022-05-24 MED ORDER — PROPOFOL 10 MG/ML IV BOLUS
INTRAVENOUS | Status: DC | PRN
  Administered 2022-05-24 (×2): 50 mg via INTRAVENOUS
  Administered 2022-05-24: 100 mg via INTRAVENOUS

## 2022-05-24 MED ORDER — POTASSIUM CHLORIDE CRYS ER 20 MEQ PO TBCR
40.0000 meq | EXTENDED_RELEASE_TABLET | Freq: Once | ORAL | Status: AC
  Administered 2022-05-24: 40 meq via ORAL
  Filled 2022-05-24: qty 2

## 2022-05-24 MED ORDER — SODIUM CHLORIDE 0.9 % IV SOLN
510.0000 mg | Freq: Once | INTRAVENOUS | Status: AC
  Administered 2022-05-24: 510 mg via INTRAVENOUS
  Filled 2022-05-24: qty 17

## 2022-05-24 MED ORDER — SODIUM CHLORIDE 0.9% IV SOLUTION: Freq: Once | INTRAVENOUS | Status: DC

## 2022-05-24 MED ORDER — OXYCODONE HCL 5 MG PO TABS: 5.0000 mg | ORAL_TABLET | Freq: Once | ORAL | Status: DC | PRN

## 2022-05-24 MED ORDER — HYDROMORPHONE HCL 1 MG/ML IJ SOLN: 0.2500 mg | INTRAMUSCULAR | Status: DC | PRN

## 2022-05-24 MED ORDER — PROMETHAZINE HCL 25 MG/ML IJ SOLN
INTRAMUSCULAR | Status: AC
  Filled 2022-05-24: qty 1

## 2022-05-24 MED ORDER — PROMETHAZINE HCL 25 MG/ML IJ SOLN
6.2500 mg | INTRAMUSCULAR | Status: DC | PRN
  Administered 2022-05-24: 12.5 mg via INTRAVENOUS

## 2022-05-24 MED ORDER — PROPOFOL 500 MG/50ML IV EMUL
INTRAVENOUS | Status: DC | PRN
  Administered 2022-05-24: 125 ug/kg/min via INTRAVENOUS

## 2022-05-24 MED ORDER — SODIUM CHLORIDE 0.9 % IV SOLN: INTRAVENOUS | Status: DC

## 2022-05-24 MED ORDER — OXYCODONE HCL 5 MG/5ML PO SOLN: 5.0000 mg | Freq: Once | ORAL | Status: DC | PRN

## 2022-05-24 NOTE — Progress Notes (Signed)
PROGRESS NOTE                                                                                                                                                                                                             Patient Demographics:    Dawn Walsh, is a 47 y.o. female, DOB - 10-08-1975, ZOX:[id number]  Outpatient Primary MD for the patient is Grayce Sessions, NP    LOS - 0  Admit date - 05/23/2022    Chief Complaint  Patient presents with   Abnormal Lab       Brief Narrative (HPI from H&P)   47 y.o. female with medical history significant of anemia, hypertension presenting with shortness of breath, weakness, abnormal labs at PCP office.  She was found to have severe symptomatic anemia, no history of active GI bleed, melanotic stools or heavy menstrual bleeding, she has had GI issues in the past and was recommended to undergo outpatient EGD colonoscopy but she refrained from doing it.  In the ER CT scan suggestive of colitis, severe anemia confirmed and she was admitted for further care.   Subjective:    Dawn Walsh today has, No headache, No chest pain, No abdominal pain - No Nausea, No new weakness tingling or numbness, no SOB   Assessment  & Plan :    Severe symptomatic iron deficiency anemia. source likely GI with intermittent bleeding, CT abdomen suggestive of diffuse colitis, check CRP and procalcitonin levels, on IV PPI, has received 2 units of packed RBC transfusion on 05/23/2022 and will get another unit on 05/24/2022, IV iron also replaced, continue to monitor CBC, GI on board to undergo endoscopic evaluation later on 05/24/2022.   Possible gastritis.  PPI.  GI evaluation as above.       Condition -  Guarded  Family Communication  :  daughter over patient's cell phone in patient's room on 05/24/2022  Code Status : Full code  Consults  : GI  PUD Prophylaxis : PPI   Procedures  :     CT  - 1. Long segment of distal to terminal ileum circumferential small bowel wall thickening suggestive of enteritis. Differential diagnosis for etiology includes infection, inflammation, ischemia. 2. Under distended rectosigmoid colon with bowel wall thickening which could represent developing colitis. 3. Recommend correlation with clinical history of possible IBD. 4.  Cholelithiasis no CT finding of acute cholecystitis. 5. Intramural uterine fibroid      Disposition Plan  :    Status is: Observation   DVT Prophylaxis  :    SCDs Start: 05/23/22 1217    Lab Results  Component Value Date   PLT 457 (H) 05/24/2022    Diet :  Diet Order             Diet NPO time specified  Diet effective now                    Inpatient Medications  Scheduled Meds:  sodium chloride   Intravenous Once   pantoprazole (PROTONIX) IV  40 mg Intravenous Q12H   potassium chloride  40 mEq Oral Once   sodium chloride flush  3 mL Intravenous Q12H   Continuous Infusions:  sodium chloride 20 mL/hr at 05/24/22 0133   ferumoxytol (FERAHEME) 510 mg in sodium chloride 0.9 % 100 mL IVPB     PRN Meds:.acetaminophen **OR** acetaminophen, HYDROcodone-acetaminophen, polyethylene glycol  Antibiotics  :    Anti-infectives (From admission, onward)    None         Objective:   Vitals:   05/23/22 2042 05/24/22 0410 05/24/22 0738 05/24/22 0940  BP: (!) 146/97 (!) 153/85  (!) 143/89  Pulse: 91 78  84  Resp: 16 14  14   Temp: 97.8 F (36.6 C) 98 F (36.7 C)  98.1 F (36.7 C)  TempSrc: Oral Oral Oral Oral  SpO2: 99% 100%  100%  Weight:      Height:        Wt Readings from Last 3 Encounters:  05/23/22 59 kg  05/12/22 58.3 kg  06/04/21 55.3 kg     Intake/Output Summary (Last 24 hours) at 05/24/2022 1001 Last data filed at 05/23/2022 1302 Gross per 24 hour  Intake 3 ml  Output --  Net 3 ml     Physical Exam  Awake Alert, No new F.N deficits, Normal affect [place].AT,PERRAL Supple Neck, No  JVD,   Symmetrical Chest wall movement, Good air movement bilaterally, CTAB RRR,No Gallops,Rubs or new Murmurs,  +ve B.Sounds, Abd Soft, No tenderness,   No Cyanosis, Clubbing or edema       Data Review:    Recent Labs  Lab 05/20/22 0852 05/23/22 1031 05/24/22 0538  WBC 5.9 5.9 5.7  HGB 5.9* 5.7* 7.0*  HCT 23.1* 22.0* 26.0*  PLT 463* 456* 457*  MCV 63* 64.7* 67.4*  MCH 16.1* 16.8* 18.1*  MCHC 25.5* 25.9* 26.9*  RDW 17.5* 19.8* 21.3*  LYMPHSABS 1.3 1.1  --   MONOABS  --  0.4  --   EOSABS 0.2 0.1  --   BASOSABS 0.0 0.1  --     Recent Labs  Lab 05/20/22 0852 05/23/22 1031 05/23/22 1455 05/24/22 0538  NA  --  137  --  139  K  --  3.3*  --  3.5  CL  --  105  --  108  CO2  --  25  --  22  ANIONGAP  --  7  --  9  GLUCOSE  --  103*  --  79  BUN  --  6  --  <5*  CREATININE  --  0.80  --  0.80  AST  --  20  --   --   ALT  --  12  --   --   ALKPHOS  --  60  --   --  BILITOT  --  0.5  --   --   ALBUMIN  --  2.8*  --   --   TSH 0.399*  --   --   --   MG  --   --  1.8  --   CALCIUM  --  8.7*  --  9.0      Radiology Reports CT ABDOMEN PELVIS W CONTRAST  Result Date: 05/24/2022 CLINICAL DATA:  LLQ abdominal pain EXAM: CT ABDOMEN AND PELVIS WITH CONTRAST TECHNIQUE: Multidetector CT imaging of the abdomen and pelvis was performed using the standard protocol following bolus administration of intravenous contrast. RADIATION DOSE REDUCTION: This exam was performed according to the departmental dose-optimization program which includes automated exposure control, adjustment of the mA and/or kV according to patient size and/or use of iterative reconstruction technique. CONTRAST:  75mL OMNIPAQUE IOHEXOL 350 MG/ML SOLN COMPARISON:  None Available. FINDINGS: Lower chest: No acute abnormality. Hepatobiliary: No focal liver abnormality. Calcified gallstone noted within the gallbladder lumen. No gallbladder wall thickening or pericholecystic fluid. No biliary dilatation. Pancreas: No  focal lesion. Normal pancreatic contour. No surrounding inflammatory changes. No main pancreatic ductal dilatation. Spleen: Normal in size without focal abnormality. Adrenals/Urinary Tract: No adrenal nodule bilaterally. Bilateral kidneys enhance symmetrically. No hydronephrosis. No hydroureter. The urinary bladder is unremarkable. Stomach/Bowel: Stomach is within normal limits. Long segment of distal to terminal ileum circumferential small bowel wall thickening (6:37, 3: 57-61). Under distended rectosigmoid colon bowel wall thickening circumferentially. No dilatation of the small or large bowel. Appendix appears normal. Vascular/Lymphatic: No abdominal aorta or iliac aneurysm. No abdominal, pelvic, or inguinal lymphadenopathy. Reproductive: 2.1 cm hypodense posterior uterine wall lesion likely uterine fibroid. Uterus and bilateral adnexa are unremarkable. Other: No intraperitoneal free fluid. No intraperitoneal free gas. No organized fluid collection. Musculoskeletal: No abdominal wall hernia or abnormality. No suspicious lytic or blastic osseous lesions. No acute displaced fracture. Mild retrolisthesis of L5 on S1. IMPRESSION: 1. Long segment of distal to terminal ileum circumferential small bowel wall thickening suggestive of enteritis. Differential diagnosis for etiology includes infection, inflammation, ischemia. 2. Under distended rectosigmoid colon with bowel wall thickening which could represent developing colitis. 3. Recommend correlation with clinical history of possible IBD. 4. Cholelithiasis no CT finding of acute cholecystitis. 5. Intramural uterine fibroid. Electronically Signed   By: Tish Frederickson M.D.   On: 05/24/2022 00:03      Signature  -   Susa Raring M.D on 05/24/2022 at 10:01 AM   -  To page go to [email or web address]

## 2022-05-24 NOTE — Transfer of Care (Signed)
Immediate Anesthesia Transfer of Care Note  Patient: Dawn Walsh  Procedure(s) Performed: COLONOSCOPY ESOPHAGOGASTRODUODENOSCOPY (EGD) BIOPSY  Patient Location: PACU and Endoscopy Unit  Anesthesia Type:MAC  Level of Consciousness: drowsy and patient cooperative  Airway & Oxygen Therapy: Patient Spontanous Breathing and Patient connected to nasal cannula oxygen  Post-op Assessment: Report given to RN and Post -op Vital signs reviewed and stable  Post vital signs: Reviewed and stable  Last Vitals:  Vitals Value Taken Time  BP 148/88 05/24/22 1213  Temp    Pulse 81 05/24/22 1215  Resp 21 05/24/22 1215  SpO2 100 % 05/24/22 1215  Vitals shown include unvalidated device data.  Last Pain:  Vitals:   05/24/22 1040  TempSrc: Tympanic  PainSc:          Complications: No notable events documented.

## 2022-05-24 NOTE — Anesthesia Preprocedure Evaluation (Signed)
Anesthesia Evaluation  Patient identified by MRN, date of birth, ID band Patient awake    Reviewed: Allergy & Precautions, H&P , NPO status , Patient's Chart, lab work & pertinent test results  Airway Mallampati: II  TM Distance: >3 FB Neck ROM: Full    Dental no notable dental hx.    Pulmonary neg pulmonary ROS, Current Smoker and Patient abstained from smoking.   Pulmonary exam normal breath sounds clear to auscultation       Cardiovascular hypertension, Pt. on medications negative cardio ROS Normal cardiovascular exam Rhythm:Regular Rate:Normal     Neuro/Psych negative neurological ROS  negative psych ROS   GI/Hepatic negative GI ROS, Neg liver ROS,,,  Endo/Other  negative endocrine ROS    Renal/GU negative Renal ROS  negative genitourinary   Musculoskeletal negative musculoskeletal ROS (+)    Abdominal   Peds negative pediatric ROS (+)  Hematology  (+) Blood dyscrasia, anemia   Anesthesia Other Findings   Reproductive/Obstetrics negative OB ROS                             Anesthesia Physical Anesthesia Plan  ASA: 2  Anesthesia Plan: MAC   Post-op Pain Management: Minimal or no pain anticipated   Induction: Intravenous  PONV Risk Score and Plan: 1 and Treatment may vary due to age or medical condition and Propofol infusion  Airway Management Planned: Nasal Cannula  Additional Equipment:   Intra-op Plan:   Post-operative Plan:   Informed Consent: I have reviewed the patients History and Physical, chart, labs and discussed the procedure including the risks, benefits and alternatives for the proposed anesthesia with the patient or authorized representative who has indicated his/her understanding and acceptance.     Dental advisory given  Plan Discussed with: CRNA  Anesthesia Plan Comments:        Anesthesia Quick Evaluation

## 2022-05-24 NOTE — Op Note (Signed)
[facility] Patient Name: Dawn Walsh Procedure Date : 05/24/2022 MRN: [id number] Attending MD: Doristine Locks , MD, [id number] Date of Birth: Jul 08, 1975 CSN: [id number] Age: 47 Admit Type: Inpatient Procedure:                Colonoscopy Indications:              Abdominal pain in the left lower quadrant, Abnormal                            CT of the GI tract, Weight loss Providers:                Doristine Locks, MD, Dartha Lodge, RN, Rozetta Nunnery, Technician Referring MD:              Medicines:                Monitored Anesthesia Care Complications:            No immediate complications. Estimated Blood Loss:     Estimated blood loss was minimal. Procedure:                Pre-Anesthesia Assessment:                           - Prior to the procedure, a History and Physical                            was performed, and patient medications and                            allergies were reviewed. The patient's tolerance of                            previous anesthesia was also reviewed. The risks                            and benefits of the procedure and the sedation                            options and risks were discussed with the patient.                            All questions were answered, and informed consent                            was obtained. Prior Anticoagulants: The patient has                            taken no anticoagulant or antiplatelet agents. ASA                            Grade Assessment: II - A patient with mild systemic  disease. After reviewing the risks and benefits,                            the patient was deemed in satisfactory condition to                            undergo the procedure.                           After obtaining informed consent, the colonoscope                            was passed under direct vision. Throughout the                             procedure, the patient's blood pressure, pulse, and                            oxygen saturations were monitored continuously. The                            PCF-HQ190L ([id number] Olympus colonoscope was                            introduced through the anus and advanced to the the                            cecum, identified by appendiceal orifice and                            ileocecal valve. The colonoscopy was performed                            without difficulty. The patient tolerated the                            procedure well. The quality of the bowel                            preparation was good. The ileocecal valve,                            appendiceal orifice, and rectum were photographed. Scope In: 11:43:11 AM Scope Out: 12:08:32 PM Scope Withdrawal Time: 0 hours 19 minutes 47 seconds  Total Procedure Duration: 0 hours 25 minutes 21 seconds  Findings:      Large skin tags were found on perianal exam.      Two five mm ulcers were found in the cecum, immediately adjacent to the       IC valve. No bleeding was present. No stigmata of recent bleeding were       seen. Biopsies were taken with a cold forceps for histology. Estimated       blood loss was minimal.      The remainder of the colon was normal.      Inflammation characterized by congestion (edema)  and erythema was found       in the ileocecal valve. Despite multiple attempts, use of external       abdominal pressure, and several repositioning techniques, the amount of       edema precluded intubation of the ileum and full evaluation. Biopsies       were taken at the IC valve with a cold forceps for histology. Estimated       blood loss was minimal.      Non-bleeding internal hemorrhoids were found during retroflexion. The       hemorrhoids were small.      Anal papilla(e) were hypertrophied. Impression:               - Perianal skin tags found on perianal exam.                           - Two ulcers in the  cecum. Biopsied.                           - The entire examined colon is normal.                           - Inflammation was found in the ileum rule out                            Crohn's disease. Biopsied.                           - Non-bleeding internal hemorrhoids.                           - Anal papilla(e) were hypertrophied. Recommendation:           - Return patient to hospital ward for ongoing care.                           - Full liquid diet today and slowly advance as                            tolerated.                           - Await pathology results.                           ?" Check ESR, CRP today                           ?" Will benefit from IV iron prior to hospital                            discharge                           ?" Received 1 additional unit of pRBCs                            intraoperatively (Hgb 7). Repeat posttransfusion  CBC check with additional blood products as needed                            per protocol                           ?" Given high clinical suspicion for small bowel                            Crohn's Disease, will initiate Solu-Medrol 20 mg                            TID while awaiting pathology results                           ?" Check QuantiFERON gold, TPMT, hepatitis B surface                            antibody, hepatitis B surface antigen in                            anticipation of biologic therapy as an outpatient Procedure Code(s):        --- Professional ---                           [phone], Colonoscopy, flexible; with biopsy, single                            or multiple Diagnosis Code(s):        --- Professional ---                           K63.3, Ulcer of intestine                           K64.8, Other hemorrhoids                           K52.9, Noninfective gastroenteritis and colitis,                            unspecified                           K62.89, Other specified  diseases of anus and rectum                           K64.4, Residual hemorrhoidal skin tags                           R10.32, Left lower quadrant pain                           R63.4, Abnormal weight loss                           R93.3, Abnormal findings on diagnostic imaging  of                            other parts of digestive tract CPT copyright 2022 [employer]. All rights reserved. The codes documented in this report are preliminary and upon coder review may  be revised to meet current compliance requirements. Doristine Locks, MD 05/24/2022 12:31:36 PM Number of Addenda: 0

## 2022-05-24 NOTE — Interval H&P Note (Signed)
History and Physical Interval Note:  No acute events overnight.  CT was completed and showed small bowel thickening in the distal and terminal ileum, area of thickening (versus underdistention) in the rectosigmoid colon, cholelithiasis without cholecystitis, 2.1 cm uterine fibroid.  Labs consistent with iron deficiency anemia with ferritin 1, iron 12, TIBC 592, sat 2%.  Good serologic response to 2 unit PRBC transfusion with H/H 7/26 today.  05/24/2022 10:59 AM  Dawn Walsh  has presented today for surgery, with the diagnosis of anemia, abdominal pain, weight loss.  The various methods of treatment have been discussed with the patient and family. After consideration of risks, benefits and other options for treatment, the patient has consented to  Procedure(s): COLONOSCOPY (N/A) ESOPHAGOGASTRODUODENOSCOPY (EGD) (N/A) as a surgical intervention.  The patient's history has been reviewed, patient examined, no change in status, stable for surgery.  I have reviewed the patient's chart and labs.  Questions were answered to the patient's satisfaction.     Verlin Dike Shavana Calder

## 2022-05-24 NOTE — Op Note (Signed)
[facility] Patient Name: Dawn Walsh Procedure Date : 05/24/2022 MRN: [id number] Attending MD: Doristine Locks , MD, [id number] Date of Birth: January 08, 1976 CSN: [id number] Age: 47 Admit Type: Inpatient Procedure:                Upper GI endoscopy Indications:              Abdominal pain in the left lower quadrant, Iron                            deficiency anemia, Heme positive stool, Abnormal CT                            of the GI tract, Nausea with vomiting, Weight loss                           Admission CT notable for small bowel thickening in                            the distal and terminal ileum, area of thickening                            (versus underdistention) in the rectosigmoid colon,                            cholelithiasis without cholecystitis, 2.1 cm                            uterine fibroid. Labs consistent with iron                            deficiency anemia with ferritin 1, iron 12, TIBC                            592, sat 2%. Good serologic response to 2 unit PRBC                            transfusion with H/H 7/26 today. Providers:                Doristine Locks, MD, Dartha Lodge, RN, Rozetta Nunnery, Technician, Roselie Awkward, RN, Harrington Challenger,                            Technician Referring MD:              Medicines:                Monitored Anesthesia Care Complications:            No immediate complications. Estimated Blood Loss:     Estimated blood loss was minimal. Procedure:                Pre-Anesthesia Assessment:                           -  Prior to the procedure, a History and Physical                            was performed, and patient medications and                            allergies were reviewed. The patient's tolerance of                            previous anesthesia was also reviewed. The risks                            and benefits of the procedure and the sedation                             options and risks were discussed with the patient.                            All questions were answered, and informed consent                            was obtained. Prior Anticoagulants: The patient has                            taken no anticoagulant or antiplatelet agents. ASA                            Grade Assessment: II - A patient with mild systemic                            disease. After reviewing the risks and benefits,                            the patient was deemed in satisfactory condition to                            undergo the procedure.                           After obtaining informed consent, the endoscope was                            passed under direct vision. Throughout the                            procedure, the patient's blood pressure, pulse, and                            oxygen saturations were monitored continuously. The                            [id number]-[id number] ([id number] Olympus endoscope was introduced  through the mouth, and advanced to the second part                            of duodenum. The upper GI endoscopy was                            accomplished without difficulty. The patient                            tolerated the procedure well. Scope In: Scope Out: Findings:      The examined esophagus was normal.      The entire examined stomach was normal. Biopsies were taken with a cold       forceps for Helicobacter pylori testing. Estimated blood loss was       minimal.      The gastroesophageal flap valve was visualized endoscopically and       classified as Hill Grade III (minimal fold, loose to endoscope, hiatal       hernia likely).      The examined duodenum was normal. Biopsies were taken with a cold       forceps for histology. Estimated blood loss was minimal. Impression:               - Normal esophagus.                           - Normal stomach. Biopsied.                           - Gastroesophageal  flap valve classified as Hill                            Grade III (minimal fold, loose to endoscope, hiatal                            hernia likely).                           - Normal examined duodenum. Biopsied. Recommendation:           - Await pathology results.                           - Perform a colonoscopy today.                           - See colonoscopy report for additional                            recommendations for ongoing inpatient care. Procedure Code(s):        --- Professional ---                           [id number], Esophagogastroduodenoscopy, flexible,                            transoral; with biopsy, single or multiple Diagnosis Code(s):        --- Professional ---  R10.32, Left lower quadrant pain                           D50.9, Iron deficiency anemia, unspecified                           R19.5, Other fecal abnormalities                           R11.2, Nausea with vomiting, unspecified                           R63.4, Abnormal weight loss                           R93.3, Abnormal findings on diagnostic imaging of                            other parts of digestive tract CPT copyright 2022 [employer]. All rights reserved. The codes documented in this report are preliminary and upon coder review may  be revised to meet current compliance requirements. Doristine Locks, MD 05/24/2022 12:19:01 PM Number of Addenda: 0

## 2022-05-24 NOTE — Progress Notes (Signed)
Patient states she is NPO and her stools are a clear liquid. Was able to complete majority of prep. 3/4 of 3rd bottle remains. RN confirms clear stools. Okay to proceed with procedures.

## 2022-05-24 NOTE — Anesthesia Postprocedure Evaluation (Signed)
Anesthesia Post Note  Patient: Dawn Walsh  Procedure(s) Performed: COLONOSCOPY ESOPHAGOGASTRODUODENOSCOPY (EGD) BIOPSY     Patient location during evaluation: PACU Anesthesia Type: MAC Level of consciousness: awake and alert Pain management: pain level controlled Vital Signs Assessment: post-procedure vital signs reviewed and stable Respiratory status: spontaneous breathing, nonlabored ventilation and respiratory function stable Cardiovascular status: blood pressure returned to baseline and stable Postop Assessment: no apparent nausea or vomiting Anesthetic complications: no   No notable events documented.  Last Vitals:  Vitals:   05/24/22 1240 05/24/22 1250  BP: (!) 160/93 (!) 190/95  Pulse: 77 80  Resp: 18 19  Temp:    SpO2: 100% 100%    Last Pain:  Vitals:   05/24/22 1214  TempSrc: Temporal  PainSc: 0-No pain                 Lowella Curb

## 2022-05-25 ENCOUNTER — Other Ambulatory Visit (HOSPITAL_COMMUNITY): Payer: Self-pay

## 2022-05-25 DIAGNOSIS — K529 Noninfective gastroenteritis and colitis, unspecified: Secondary | ICD-10-CM | POA: Diagnosis not present

## 2022-05-25 DIAGNOSIS — D649 Anemia, unspecified: Secondary | ICD-10-CM | POA: Diagnosis not present

## 2022-05-25 DIAGNOSIS — K633 Ulcer of intestine: Secondary | ICD-10-CM | POA: Diagnosis not present

## 2022-05-25 DIAGNOSIS — D509 Iron deficiency anemia, unspecified: Secondary | ICD-10-CM | POA: Diagnosis not present

## 2022-05-25 LAB — CBC WITH DIFFERENTIAL/PLATELET
Abs Immature Granulocytes: 0.05 10*3/uL (ref 0.00–0.07)
Basophils Absolute: 0 10*3/uL (ref 0.0–0.1)
Basophils Relative: 1 %
Eosinophils Absolute: 0.1 10*3/uL (ref 0.0–0.5)
Eosinophils Relative: 2 %
HCT: 27.7 % — ABNORMAL LOW (ref 36.0–46.0)
Hemoglobin: 8.1 g/dL — ABNORMAL LOW (ref 12.0–15.0)
Immature Granulocytes: 1 %
Lymphocytes Relative: 23 %
Lymphs Abs: 1.2 10*3/uL (ref 0.7–4.0)
MCH: 20.1 pg — ABNORMAL LOW (ref 26.0–34.0)
MCHC: 29.2 g/dL — ABNORMAL LOW (ref 30.0–36.0)
MCV: 68.7 fL — ABNORMAL LOW (ref 80.0–100.0)
Monocytes Absolute: 0.4 10*3/uL (ref 0.1–1.0)
Monocytes Relative: 8 %
Neutro Abs: 3.5 10*3/uL (ref 1.7–7.7)
Neutrophils Relative %: 65 %
Platelets: 402 10*3/uL — ABNORMAL HIGH (ref 150–400)
RBC: 4.03 MIL/uL (ref 3.87–5.11)
RDW: 22.8 % — ABNORMAL HIGH (ref 11.5–15.5)
WBC: 5.3 10*3/uL (ref 4.0–10.5)
nRBC: 0.4 % — ABNORMAL HIGH (ref 0.0–0.2)

## 2022-05-25 LAB — TYPE AND SCREEN
ABO/RH(D): A POS
Antibody Screen: NEGATIVE
Unit division: 0
Unit division: 0

## 2022-05-25 LAB — BASIC METABOLIC PANEL
Anion gap: 8 (ref 5–15)
BUN: <5 mg/dL — ABNORMAL LOW (ref 6–20)
CO2: 20 mmol/L — ABNORMAL LOW (ref 22–32)
Calcium: 8.8 mg/dL — ABNORMAL LOW (ref 8.9–10.3)
Chloride: 109 mmol/L (ref 98–111)
Creatinine, Ser: 0.82 mg/dL (ref 0.44–1.00)
GFR, Estimated: >60 mL/min (ref >60)
Glucose, Bld: 73 mg/dL (ref 70–99)
Potassium: 3.8 mmol/L (ref 3.5–5.1)
Sodium: 137 mmol/L (ref 135–145)

## 2022-05-25 LAB — BPAM RBC
Blood Product Expiration Date: 202405162359
ISSUE DATE / TIME: 202404291555

## 2022-05-25 LAB — C-REACTIVE PROTEIN: CRP: <0.5 mg/dL (ref <1.0)

## 2022-05-25 LAB — PROCALCITONIN: Procalcitonin: <0.1 ng/mL

## 2022-05-25 LAB — SEDIMENTATION RATE: Sed Rate: 41 mm/hr — ABNORMAL HIGH (ref 0–22)

## 2022-05-25 LAB — HEPATITIS B SURFACE ANTIGEN: Hepatitis B Surface Ag: NONREACTIVE

## 2022-05-25 LAB — VITAMIN B12: Vitamin B-12: 486 pg/mL (ref 180–914)

## 2022-05-25 LAB — HEPATITIS B SURFACE ANTIBODY,QUALITATIVE: Hep B S Ab: NONREACTIVE

## 2022-05-25 LAB — SURGICAL PATHOLOGY

## 2022-05-25 LAB — MAGNESIUM: Magnesium: 2 mg/dL (ref 1.7–2.4)

## 2022-05-25 MED ORDER — PANTOPRAZOLE SODIUM 40 MG PO TBEC
40.0000 mg | DELAYED_RELEASE_TABLET | Freq: Every day | ORAL | 0 refills | Status: DC
Start: 2022-05-25 — End: 2022-11-08
  Filled 2022-05-25 – 2022-06-25 (×2): qty 30, 30d supply, fill #0

## 2022-05-25 MED ORDER — VITAMIN B-12 1000 MCG PO TABS
500.0000 ug | ORAL_TABLET | Freq: Every day | ORAL | Status: DC
  Administered 2022-05-25: 500 ug via ORAL
  Filled 2022-05-25: qty 1

## 2022-05-25 MED ORDER — PREDNISONE 10 MG PO TABS
ORAL_TABLET | ORAL | 0 refills | Status: AC
  Filled 2022-05-25: qty 98, 35d supply, fill #0

## 2022-05-25 MED ORDER — METHYLPREDNISOLONE SODIUM SUCC 40 MG IJ SOLR
20.0000 mg | Freq: Three times a day (TID) | INTRAMUSCULAR | Status: DC
  Administered 2022-05-25 (×2): 20 mg via INTRAVENOUS
  Filled 2022-05-25 (×2): qty 1

## 2022-05-25 MED ORDER — CYANOCOBALAMIN 250 MCG PO TABS: 250.0000 ug | ORAL_TABLET | Freq: Every day | ORAL | Status: AC

## 2022-05-25 NOTE — Discharge Summary (Signed)
Physician Discharge Summary  Dawn Walsh:[id number] DOB: May 03, 1975 DOA: 05/23/2022  PCP: Grayce Sessions, NP  Admit date: 05/23/2022 Discharge date: 05/25/2022  Admitted From: (Home) Disposition:  (Home)  Recommendations for Outpatient Follow-up:  Follow up with PCP in 1-2 weeks Please obtain BMP/CBC in one week Please follow up on the following pending results:    Discharge Condition: (Stable) CODE STATUS: (FULL) Diet recommendation: Heart Healthy   Brief/Interim Summary:  ileitis Symptomatic iron deficiency anemia Weight loss -GI input greatly appreciated, EGD largely unrevealing, but her colonoscopy significant for ileitis, will and to ulcers needle at the IC valve, highly suspicious for Crohn's disease, pathology is pending at time of discharge, patient is eager to go home today, unwilling to wait for another day or 2, so she will be discharged on prednisone taper per GI recommendation, and she is to follow with GI as an outpatient, labs has been sent cleared for discharge as she might require biologic therapy as an outpatient. -Received PRBC transfusions, then received IV iron's   Nausea/Vomiting -Resolved   Abdominal pain Likely secondary to ileitis/Crohn's disease.      Discharge Diagnoses:  Principal Problem:   Symptomatic anemia Active Problems:   Abdominal pain   Nausea and vomiting   Unintentional weight loss   Ileitis    Discharge Instructions  Discharge Instructions     Diet - low sodium heart healthy   Complete by: As directed    Discharge instructions   Complete by: As directed    Follow with Primary MD Grayce Sessions, NP in 7 days   Get CBC, CMP, checked  by Primary MD next visit.    Activity: As tolerated with Full fall precautions use walker/cane & assistance as needed   Disposition Home    Diet: Heart Healthy    On your next visit with your primary care physician please Get Medicines reviewed and  adjusted.   Please request your Prim.MD to go over all Hospital Tests and Procedure/Radiological results at the follow up, please get all Hospital records sent to your Prim MD by signing hospital release before you go home.   If you experience worsening of your admission symptoms, develop shortness of breath, life threatening emergency, suicidal or homicidal thoughts you must seek medical attention immediately by calling 911 or calling your MD immediately  if symptoms less severe.  You Must read complete instructions/literature along with all the possible adverse reactions/side effects for all the Medicines you take and that have been prescribed to you. Take any new Medicines after you have completely understood and accpet all the possible adverse reactions/side effects.   Do not drive, operating heavy machinery, perform activities at heights, swimming or participation in water activities or provide baby sitting services if your were admitted for syncope or siezures until you have seen by Primary MD or a Neurologist and advised to do so again.  Do not drive when taking Pain medications.    Do not take more than prescribed Pain, Sleep and Anxiety Medications  Special Instructions: If you have smoked or chewed Tobacco  in the last 2 yrs please stop smoking, stop any regular Alcohol  and or any Recreational drug use.  Wear Seat belts while driving.   Please note  You were cared for by a hospitalist during your hospital stay. If you have any questions about your discharge medications or the care you received while you were in the hospital after you are discharged, you can call the unit  and asked to speak with the hospitalist on call if the hospitalist that took care of you is not available. Once you are discharged, your primary care physician will handle any further medical issues. Please note that NO REFILLS for any discharge medications will be authorized once you are discharged, as it is  imperative that you return to your primary care physician (or establish a relationship with a primary care physician if you do not have one) for your aftercare needs so that they can reassess your need for medications and monitor your lab values.   Increase activity slowly   Complete by: As directed       Allergies as of 05/25/2022       Reactions   Morphine And Related Itching        Medication List     TAKE these medications    cyanocobalamin 250 MCG tablet Commonly known as: VITAMIN B12 Take 1 tablet (250 mcg total) by mouth daily.   pantoprazole 40 MG tablet Commonly known as: Protonix Take 1 tablet (40 mg total) by mouth daily.   predniSONE 10 MG tablet Commonly known as: DELTASONE Take 4 tablets (40 mg total) by mouth daily for 14 days, THEN 3 tablets (30 mg total) daily for 7 days, THEN 2 tablets (20 mg total) daily for 7 days, THEN 1 tablet (10 mg total) daily for 7 days. Start taking on: May 25, 2022        Follow-up Information     Arnaldo Natal, NP Follow up on 08/01/2022.   Specialty: Gastroenterology Why: Outpatient appt scheduled 9am. please arrive 10-15 minutes prior to appt time. Contact information: [address] [place] [address] [phone]                Allergies  Allergen Reactions   Morphine And Related Itching    Consultations: Gastroenterology   Procedures/Studies: CT ABDOMEN PELVIS W CONTRAST  Result Date: 05/24/2022 CLINICAL DATA:  LLQ abdominal pain EXAM: CT ABDOMEN AND PELVIS WITH CONTRAST TECHNIQUE: Multidetector CT imaging of the abdomen and pelvis was performed using the standard protocol following bolus administration of intravenous contrast. RADIATION DOSE REDUCTION: This exam was performed according to the departmental dose-optimization program which includes automated exposure control, adjustment of the mA and/or kV according to patient size and/or use of iterative reconstruction technique. CONTRAST:  75mL  OMNIPAQUE IOHEXOL 350 MG/ML SOLN COMPARISON:  None Available. FINDINGS: Lower chest: No acute abnormality. Hepatobiliary: No focal liver abnormality. Calcified gallstone noted within the gallbladder lumen. No gallbladder wall thickening or pericholecystic fluid. No biliary dilatation. Pancreas: No focal lesion. Normal pancreatic contour. No surrounding inflammatory changes. No main pancreatic ductal dilatation. Spleen: Normal in size without focal abnormality. Adrenals/Urinary Tract: No adrenal nodule bilaterally. Bilateral kidneys enhance symmetrically. No hydronephrosis. No hydroureter. The urinary bladder is unremarkable. Stomach/Bowel: Stomach is within normal limits. Long segment of distal to terminal ileum circumferential small bowel wall thickening (6:37, 3: 57-61). Under distended rectosigmoid colon bowel wall thickening circumferentially. No dilatation of the small or large bowel. Appendix appears normal. Vascular/Lymphatic: No abdominal aorta or iliac aneurysm. No abdominal, pelvic, or inguinal lymphadenopathy. Reproductive: 2.1 cm hypodense posterior uterine wall lesion likely uterine fibroid. Uterus and bilateral adnexa are unremarkable. Other: No intraperitoneal free fluid. No intraperitoneal free gas. No organized fluid collection. Musculoskeletal: No abdominal wall hernia or abnormality. No suspicious lytic or blastic osseous lesions. No acute displaced fracture. Mild retrolisthesis of L5 on S1. IMPRESSION: 1. Long segment of distal to terminal  ileum circumferential small bowel wall thickening suggestive of enteritis. Differential diagnosis for etiology includes infection, inflammation, ischemia. 2. Under distended rectosigmoid colon with bowel wall thickening which could represent developing colitis. 3. Recommend correlation with clinical history of possible IBD. 4. Cholelithiasis no CT finding of acute cholecystitis. 5. Intramural uterine fibroid. Electronically Signed   By: Tish Frederickson M.D.    On: 05/24/2022 00:03      Subjective: No significant events overnight, she denies any complaints today, she is eager to go home.  Discharge Exam: Vitals:   05/25/22 0807 05/25/22 1132  BP: (!) 134/90 (!) 143/98  Pulse: 89 90  Resp: (!) 22 17  Temp: 98.2 F (36.8 C)   SpO2: 100% 97%   Vitals:   05/24/22 2327 05/25/22 0522 05/25/22 0807 05/25/22 1132  BP: (!) 144/88 (!) 144/90 (!) 134/90 (!) 143/98  Pulse:  83 89 90  Resp:  17 (!) 22 17  Temp: 98.3 F (36.8 C) 98.1 F (36.7 C) 98.2 F (36.8 C)   TempSrc: Tympanic Oral Oral   SpO2:  99% 100% 97%  Weight:      Height:        General: Pt is alert, awake, not in acute distress Cardiovascular: RRR, S1/S2 +, no rubs, no gallops Respiratory: CTA bilaterally, no wheezing, no rhonchi Abdominal: Soft, NT, ND, bowel sounds + Extremities: no edema, no cyanosis    The results of significant diagnostics from this hospitalization (including imaging, microbiology, ancillary and laboratory) are listed below for reference.     Microbiology: No results found for this or any previous visit (from the past 240 hour(s)).   Labs: BNP (last 3 results) No results for input(s): "BNP" in the last 8760 hours. Basic Metabolic Panel: Recent Labs  Lab 05/23/22 1031 05/23/22 1455 05/24/22 0538 05/25/22 0602  NA 137  --  139 137  K 3.3*  --  3.5 3.8  CL 105  --  108 109  CO2 25  --  22 20*  GLUCOSE 103*  --  79 73  BUN 6  --  <5* <5*  CREATININE 0.80  --  0.80 0.82  CALCIUM 8.7*  --  9.0 8.8*  MG  --  1.8  --  2.0   Liver Function Tests: Recent Labs  Lab 05/23/22 1031  AST 20  ALT 12  ALKPHOS 60  BILITOT 0.5  PROT 6.5  ALBUMIN 2.8*   No results for input(s): "LIPASE", "AMYLASE" in the last 168 hours. No results for input(s): "AMMONIA" in the last 168 hours. CBC: Recent Labs  Lab 05/20/22 0852 05/23/22 1031 05/24/22 0538 05/24/22 1600 05/25/22 0602  WBC 5.9 5.9 5.7 6.0 5.3  NEUTROABS 4.0 4.3  --   --  3.5  HGB 5.9*  5.7* 7.0* 7.4* 8.1*  HCT 23.1* 22.0* 26.0* 26.2* 27.7*  MCV 63* 64.7* 67.4* 69.3* 68.7*  PLT 463* 456* 457* 384 402*   Cardiac Enzymes: No results for input(s): "CKTOTAL", "CKMB", "CKMBINDEX", "TROPONINI" in the last 168 hours. BNP: Invalid input(s): "POCBNP" CBG: No results for input(s): "GLUCAP" in the last 168 hours. D-Dimer No results for input(s): "DDIMER" in the last 72 hours. Hgb A1c No results for input(s): "HGBA1C" in the last 72 hours. Lipid Profile No results for input(s): "CHOL", "HDL", "LDLCALC", "TRIG", "CHOLHDL", "LDLDIRECT" in the last 72 hours. Thyroid function studies No results for input(s): "TSH", "T4TOTAL", "T3FREE", "THYROIDAB" in the last 72 hours.  Invalid input(s): "FREET3" Anemia work up [employer]    05/23/22  1219 05/23/22 1455 05/25/22 0602  VITAMINB12  --   --  486  FERRITIN  --  1*  --   TIBC  --  592*  --   IRON  --  12*  --   RETICCTPCT 1.1  --   --    Urinalysis    Component Value Date/Time   COLORURINE YELLOW 06/01/2012 1203   APPEARANCEUR HAZY (A) 06/01/2012 1203   LABSPEC >=1.030 03/03/2022 1252   PHURINE 6.5 03/03/2022 1252   GLUCOSEU NEGATIVE 03/03/2022 1252   HGBUR TRACE (A) 03/03/2022 1252   BILIRUBINUR NEGATIVE 03/03/2022 1252   KETONESUR NEGATIVE 03/03/2022 1252   PROTEINUR 30 (A) 03/03/2022 1252   UROBILINOGEN 0.2 03/03/2022 1252   NITRITE NEGATIVE 03/03/2022 1252   LEUKOCYTESUR SMALL (A) 03/03/2022 1252   Sepsis Labs Recent Labs  Lab 05/23/22 1031 05/24/22 0538 05/24/22 1600 05/25/22 0602  WBC 5.9 5.7 6.0 5.3   Microbiology No results found for this or any previous visit (from the past 240 hour(s)).   Time coordinating discharge: Over 30 minutes  SIGNED:   Huey Bienenstock, MD  [facility] 05/25/2022, 12:36 PM Pager   If 7PM-7AM, please contact night-coverage [email or web address]

## 2022-05-25 NOTE — Progress Notes (Signed)
[facility] ROUNDING NOTE   Subjective: No acute events overnight.  Completed EGD (normal) and colonoscopy (ileitis, ulcer in the cecum near the IC valve; biopsies pending) yesterday.  Received IV iron this morning.  Started on IV Solu-Medrol.  She is very eager to leave the hospital.   Objective: Vital signs in last 24 hours: Temp:  [97 F (36.1 C)-98.6 F (37 C)] 98.2 F (36.8 C) (05/01 0807) Pulse Rate:  [77-89] 89 (05/01 0807) Resp:  [17-22] 22 (05/01 0807) BP: (134-193)/(88-95) 134/90 (05/01 0807) SpO2:  [99 %-100 %] 100 % (05/01 0807) Last BM Date : 05/24/22 General: NAD Abdomen:  Soft, NT, ND, +BS Ext:  No c/c/e    Intake/Output from previous day: 04/30 0701 - 05/01 0700 In: 2262 [I.V.:400; Blood:1862] Out: -  Intake/Output this shift: Total I/O In: 240 [P.O.:240] Out: -    Lab Results: Recent Labs    05/24/22 0538 05/24/22 1600 05/25/22 0602  WBC 5.7 6.0 5.3  HGB 7.0* 7.4* 8.1*  PLT 457* 384 402*  MCV 67.4* 69.3* 68.7*   BMET Recent Labs    05/23/22 1031 05/24/22 0538 05/25/22 0602  NA 137 139 137  K 3.3* 3.5 3.8  CL 105 108 109  CO2 25 22 20*  GLUCOSE 103* 79 73  BUN 6 <5* <5*  CREATININE 0.80 0.80 0.82  CALCIUM 8.7* 9.0 8.8*   LFT Recent Labs    05/23/22 1031  PROT 6.5  ALBUMIN 2.8*  AST 20  ALT 12  ALKPHOS 60  BILITOT 0.5   PT/INR No results for input(s): "INR" in the last 72 hours.    Imaging/Other results: CT ABDOMEN PELVIS W CONTRAST  Result Date: 05/24/2022 CLINICAL DATA:  LLQ abdominal pain EXAM: CT ABDOMEN AND PELVIS WITH CONTRAST TECHNIQUE: Multidetector CT imaging of the abdomen and pelvis was performed using the standard protocol following bolus administration of intravenous contrast. RADIATION DOSE REDUCTION: This exam was performed according to the departmental dose-optimization program which includes automated exposure control, adjustment of the mA and/or kV according to patient size and/or use of  iterative reconstruction technique. CONTRAST:  75mL OMNIPAQUE IOHEXOL 350 MG/ML SOLN COMPARISON:  None Available. FINDINGS: Lower chest: No acute abnormality. Hepatobiliary: No focal liver abnormality. Calcified gallstone noted within the gallbladder lumen. No gallbladder wall thickening or pericholecystic fluid. No biliary dilatation. Pancreas: No focal lesion. Normal pancreatic contour. No surrounding inflammatory changes. No main pancreatic ductal dilatation. Spleen: Normal in size without focal abnormality. Adrenals/Urinary Tract: No adrenal nodule bilaterally. Bilateral kidneys enhance symmetrically. No hydronephrosis. No hydroureter. The urinary bladder is unremarkable. Stomach/Bowel: Stomach is within normal limits. Long segment of distal to terminal ileum circumferential small bowel wall thickening (6:37, 3: 57-61). Under distended rectosigmoid colon bowel wall thickening circumferentially. No dilatation of the small or large bowel. Appendix appears normal. Vascular/Lymphatic: No abdominal aorta or iliac aneurysm. No abdominal, pelvic, or inguinal lymphadenopathy. Reproductive: 2.1 cm hypodense posterior uterine wall lesion likely uterine fibroid. Uterus and bilateral adnexa are unremarkable. Other: No intraperitoneal free fluid. No intraperitoneal free gas. No organized fluid collection. Musculoskeletal: No abdominal wall hernia or abnormality. No suspicious lytic or blastic osseous lesions. No acute displaced fracture. Mild retrolisthesis of L5 on S1. IMPRESSION: 1. Long segment of distal to terminal ileum circumferential small bowel wall thickening suggestive of enteritis. Differential diagnosis for etiology includes infection, inflammation, ischemia. 2. Under distended rectosigmoid colon with bowel wall thickening which could represent developing colitis. 3. Recommend correlation with clinical history of possible IBD. 4. Cholelithiasis no  CT finding of acute cholecystitis. 5. Intramural uterine fibroid.  Electronically Signed   By: Tish Frederickson M.D.   On: 05/24/2022 00:03      Assessment and Plan:  1) Ileitis 2) Iron deficiency anemia 3) Weight loss EGD was largely unrevealing.  Colonoscopy with significant ileitis which precluded full intubation of the terminal ileum.  There were 2 ulcers near the IC valve.  Endoscopic appearance and clinical presentation all highly suspicious for Crohn's Disease.  Pathology pending.  Patient is very eager to discharge to home today.  We discussed the likely diagnosis of Crohn's Disease at length today, to include pathophysiology, natural course, and potential treatment options.  Plan as follows: - Solu-Medrol today, then discharged with prednisone 40 mg daily x 2 weeks, then reduce by 10 mg every 7 days - Will arrange for outpatient follow-up in the GI clinic - CRP normal at diagnosis - Check ESR - Check QuantiFERON gold, hepatitis B, TPMT in anticipation of starting biologic therapy - Briefly reviewed biologic therapy, to include risk/benefit profile.  Will ultimately make a decision on long-term treatment options outpatient follow-up - Will follow-up on pending pathology results - Plan for outpatient CT enterography - Outpatient vaccine series - Received PRBC transfusion with appropriate serologic response, then IV iron. - Repeat CBC 7-10 days after hospital discharge.  Repeat iron panel in 3 months  4) Nausea/Vomiting - Resolved.  Asking for full diet - Advancing diet today  5) Abdominal pain Secondary to ileitis/Crohn's disease.  Improved today       Shellia Cleverly, DO  05/25/2022, 11:05 AM [facility] Pager [phone]

## 2022-05-25 NOTE — Discharge Instructions (Signed)
Follow with Primary MD Edwards, Michelle P, NP in 7 days   Get CBC, CMP,  checked  by Primary MD next visit.    Activity: As tolerated with Full fall precautions use walker/cane & assistance as needed   Disposition Home   Diet: Heart Healthy   On your next visit with your primary care physician please Get Medicines reviewed and adjusted.   Please request your Prim.MD to go over all Hospital Tests and Procedure/Radiological results at the follow up, please get all Hospital records sent to your Prim MD by signing hospital release before you go home.   If you experience worsening of your admission symptoms, develop shortness of breath, life threatening emergency, suicidal or homicidal thoughts you must seek medical attention immediately by calling 911 or calling your MD immediately  if symptoms less severe.  You Must read complete instructions/literature along with all the possible adverse reactions/side effects for all the Medicines you take and that have been prescribed to you. Take any new Medicines after you have completely understood and accpet all the possible adverse reactions/side effects.   Do not drive, operating heavy machinery, perform activities at heights, swimming or participation in water activities or provide baby sitting services if your were admitted for syncope or siezures until you have seen by Primary MD or a Neurologist and advised to do so again.  Do not drive when taking Pain medications.    Do not take more than prescribed Pain, Sleep and Anxiety Medications  Special Instructions: If you have smoked or chewed Tobacco  in the last 2 yrs please stop smoking, stop any regular Alcohol  and or any Recreational drug use.  Wear Seat belts while driving.   Please note  You were cared for by a hospitalist during your hospital stay. If you have any questions about your discharge medications or the care you received while you were in the hospital after you are  discharged, you can call the unit and asked to speak with the hospitalist on call if the hospitalist that took care of you is not available. Once you are discharged, your primary care physician will handle any further medical issues. Please note that NO REFILLS for any discharge medications will be authorized once you are discharged, as it is imperative that you return to your primary care physician (or establish a relationship with a primary care physician if you do not have one) for your aftercare needs so that they can reassess your need for medications and monitor your lab values. 

## 2022-05-25 NOTE — Care Management (Addendum)
  Transition of Care (TOC) Screening Note   Patient Details  Name: Dawn Walsh Date of Birth: 04-22-1975   Transition of Care [facility]) CM/SW Contact:    Gordy Clement, RN Phone Number: 05/25/2022, 2:13 PM    Transition of Care Department [facility]) has reviewed patient and no TOC needs have been identified at this time. If new patient transition needs arise, please place a TOC consult.

## 2022-05-26 ENCOUNTER — Encounter (HOSPITAL_COMMUNITY): Payer: Self-pay | Admitting: Gastroenterology

## 2022-05-26 ENCOUNTER — Telehealth: Payer: Self-pay

## 2022-05-26 NOTE — Transitions of Care (Post Inpatient/ED Visit) (Signed)
   06/30/2022  Name: Dawn Walsh MRN: [id number] DOB: [birth year]  Today's TOC FU Call Status: Today's TOC FU Call Status:: Unsuccessful Call (3rd Attempt) Unsuccessful Call (1st Attempt) Date: 05/26/22 Unsuccessful Call (2nd Attempt) Date: 06/30/22 (patient phone in not in service)  Attempted to reach the patient regarding the most recent Inpatient/ED visit.  Follow Up Plan: No further outreach attempts will be made at this time. We have been unable to contact the patient.  Signature  Otho Darner

## 2022-05-30 LAB — QUANTIFERON-TB GOLD PLUS (RQFGPL): QuantiFERON TB1 Ag Value: 0.01 IU/mL

## 2022-05-30 LAB — QUANTIFERON-TB GOLD PLUS: QuantiFERON-TB Gold Plus: UNDETERMINED — AB

## 2022-06-09 LAB — QUANTIFERON-TB GOLD PLUS (RQFGPL)
QuantiFERON Mitogen Value: 0.4 IU/mL
QuantiFERON Nil Value: 0.01 IU/mL
QuantiFERON TB2 Ag Value: 0.01 IU/mL

## 2022-06-09 LAB — QUANTIFERON-TB GOLD PLUS

## 2022-06-25 ENCOUNTER — Other Ambulatory Visit (HOSPITAL_COMMUNITY): Payer: Self-pay

## 2022-06-30 ENCOUNTER — Encounter (INDEPENDENT_AMBULATORY_CARE_PROVIDER_SITE_OTHER): Payer: Self-pay | Admitting: Primary Care

## 2022-06-30 ENCOUNTER — Other Ambulatory Visit (HOSPITAL_COMMUNITY): Payer: Self-pay

## 2022-07-01 ENCOUNTER — Other Ambulatory Visit: Payer: Self-pay

## 2022-07-01 ENCOUNTER — Other Ambulatory Visit (HOSPITAL_COMMUNITY): Payer: Self-pay

## 2022-07-01 NOTE — Telephone Encounter (Signed)
Pt stated that she only had two pills left of the prednisone

## 2022-07-01 NOTE — Telephone Encounter (Signed)
Dawn Walsh, She was discharged from the hospital one month ago on Prednisone 40mg  with taper instructions.  Pls verify is she completely off prednisone at this time?? Thanks.

## 2022-07-01 NOTE — Telephone Encounter (Signed)
Spoke with pt over the phone. Pt stated that she is wondering if she needs more of the prednisone prior to her Office visit. Pt stated that she is feeling better although she states that she is still having left sided abdominal pain in the evening that is constant.  Sharp in nature. BM are normal now. Last  BM today. No nausea,No temp.  Please advise

## 2022-07-05 ENCOUNTER — Other Ambulatory Visit: Payer: Self-pay

## 2022-07-05 ENCOUNTER — Telehealth: Payer: Self-pay | Admitting: Nurse Practitioner

## 2022-07-05 DIAGNOSIS — R109 Unspecified abdominal pain: Secondary | ICD-10-CM

## 2022-07-05 DIAGNOSIS — R634 Abnormal weight loss: Secondary | ICD-10-CM

## 2022-07-05 MED ORDER — PREDNISONE 10 MG PO TABS
ORAL_TABLET | ORAL | 0 refills | Status: AC
Start: 2022-07-05 — End: 2022-07-26

## 2022-07-05 NOTE — Telephone Encounter (Signed)
Dawn Walsh, in addition to the lab orders below, pls add IBC+ ferritin panel. DX: IDA.  THX

## 2022-07-05 NOTE — Telephone Encounter (Signed)
Left msg on pt's vm informing him that the CT scan was approved but only for [facility] [address] [place] [address]. This is due to it being one of the few facilities in network with his plan. [facility] and other [place] facilities appeared to be out of network. Left msg for him to call back If he has further questions and if the study at [facility] needs to be canceled.

## 2022-07-05 NOTE — Telephone Encounter (Signed)
Pt was made aware of Alcide Evener NP recommendations: Orders for labs placed in Epic. Pt made aware.  Prescription sent to pharmacy pt made aware. Pt scheduled for an CT enterography on 07/13/2022 at 1:00 PM at [facility]. Pt to arrive at 11:30 PM. NPO 4 hours prior.  Pt made aware. Pt was notified that she will be called back in the next several day for a sooner appointment. Pt verbalized understanding with all questions answered.

## 2022-07-05 NOTE — Telephone Encounter (Signed)
Dawn Walsh, ok to send in RX for Prednisone 10mg  tab take 2 tabs po QD x 1 week then 1 tab po QD x 1 weeks then 1/2 tab po QD x 1 week. Send her to the lab for a CBC, CMP and Lipase level. Patient to stay on Pantoprazole 40mg  QD, refill RX if needed. Schedule her for an earlier office visit with [name] or APP.  Schedule her for a small bowel enterography to further assess for small bowel Crohn's dz. Patient to contact office if symptoms worsen. THX.  [name], let me know if you have other recommendations. THX.

## 2022-07-06 ENCOUNTER — Other Ambulatory Visit (HOSPITAL_COMMUNITY): Payer: Self-pay

## 2022-07-06 NOTE — Telephone Encounter (Signed)
Pt was notified of sooner appointments than her original 08/01/2022 at 9:00 and Alcide Evener NP recommendation for her to have an earlier appointment. Pt stated that she can't be missing a lot of days from work and also stated that her grandmother had just passed away and that she will have to miss time for her funeral. Pt stated that she request to keep the original appointment on 08/01/2022.  Pt verbalized understanding with all questions answered.

## 2022-07-07 ENCOUNTER — Other Ambulatory Visit (INDEPENDENT_AMBULATORY_CARE_PROVIDER_SITE_OTHER): Payer: BLUE CROSS/BLUE SHIELD

## 2022-07-07 DIAGNOSIS — R109 Unspecified abdominal pain: Secondary | ICD-10-CM | POA: Diagnosis not present

## 2022-07-07 DIAGNOSIS — R634 Abnormal weight loss: Secondary | ICD-10-CM | POA: Diagnosis not present

## 2022-07-07 LAB — COMPREHENSIVE METABOLIC PANEL
ALT: 9 U/L (ref 0–35)
AST: 12 U/L (ref 0–37)
Albumin: 3.5 g/dL (ref 3.5–5.2)
Alkaline Phosphatase: 50 U/L (ref 39–117)
BUN: 11 mg/dL (ref 6–23)
CO2: 26 mEq/L (ref 19–32)
Calcium: 8.7 mg/dL (ref 8.4–10.5)
Chloride: 109 mEq/L (ref 96–112)
Creatinine, Ser: 0.74 mg/dL (ref 0.40–1.20)
GFR: 98.15 mL/min (ref >60.00)
Glucose, Bld: 79 mg/dL (ref 70–99)
Potassium: 3.7 mEq/L (ref 3.5–5.1)
Sodium: 141 mEq/L (ref 135–145)
Total Bilirubin: 0.3 mg/dL (ref 0.2–1.2)
Total Protein: 6.5 g/dL (ref 6.0–8.3)

## 2022-07-07 LAB — IBC + FERRITIN
Ferritin: 4.1 ng/mL — ABNORMAL LOW (ref 10.0–291.0)
Iron: 22 ug/dL — ABNORMAL LOW (ref 42–145)
Saturation Ratios: 4.4 % — ABNORMAL LOW (ref 20.0–50.0)
TIBC: 502.6 ug/dL — ABNORMAL HIGH (ref 250.0–450.0)
Transferrin: 359 mg/dL (ref 212.0–360.0)

## 2022-07-07 LAB — CBC WITH DIFFERENTIAL/PLATELET
Basophils Absolute: 0 10*3/uL (ref 0.0–0.1)
Basophils Relative: 0.7 % (ref 0.0–3.0)
Eosinophils Absolute: 0.1 10*3/uL (ref 0.0–0.7)
Eosinophils Relative: 1.2 % (ref 0.0–5.0)
HCT: 32.2 % — ABNORMAL LOW (ref 36.0–46.0)
Hemoglobin: 9.9 g/dL — ABNORMAL LOW (ref 12.0–15.0)
Lymphocytes Relative: 24.8 % (ref 12.0–46.0)
Lymphs Abs: 1.7 10*3/uL (ref 0.7–4.0)
MCHC: 30.9 g/dL (ref 30.0–36.0)
MCV: 81.1 fl (ref 78.0–100.0)
Monocytes Absolute: 0.6 10*3/uL (ref 0.1–1.0)
Monocytes Relative: 8.2 % (ref 3.0–12.0)
Neutro Abs: 4.4 10*3/uL (ref 1.4–7.7)
Neutrophils Relative %: 65.1 % (ref 43.0–77.0)
Platelets: 383 10*3/uL (ref 150.0–400.0)
RBC: 3.97 Mil/uL (ref 3.87–5.11)
RDW: 32.1 % — ABNORMAL HIGH (ref 11.5–15.5)
WBC: 6.7 10*3/uL (ref 4.0–10.5)

## 2022-07-07 LAB — LIPASE: Lipase: 33 U/L (ref 11.0–59.0)

## 2022-07-11 ENCOUNTER — Other Ambulatory Visit: Payer: Self-pay

## 2022-07-11 DIAGNOSIS — R634 Abnormal weight loss: Secondary | ICD-10-CM

## 2022-07-11 DIAGNOSIS — R109 Unspecified abdominal pain: Secondary | ICD-10-CM

## 2022-07-11 NOTE — Telephone Encounter (Signed)
Contacted pt regarding CT Scan being done at [facility] & pt stated that she did not have a ride to [facility]. Please advise.

## 2022-07-12 ENCOUNTER — Other Ambulatory Visit: Payer: Self-pay

## 2022-07-12 DIAGNOSIS — D649 Anemia, unspecified: Secondary | ICD-10-CM

## 2022-07-12 NOTE — Telephone Encounter (Signed)
DD, schedule her for a follow up appointment with [name]. Pt to reschedule CT enterography when she has a ride. THX.

## 2022-07-13 ENCOUNTER — Other Ambulatory Visit (HOSPITAL_COMMUNITY): Payer: Self-pay

## 2022-07-13 ENCOUNTER — Ambulatory Visit (HOSPITAL_BASED_OUTPATIENT_CLINIC_OR_DEPARTMENT_OTHER)
Admission: RE | Admit: 2022-07-13 | Discharge: 2022-07-13 | Disposition: A | Discharge status: Home / Self Care | Payer: BLUE CROSS/BLUE SHIELD | Source: Ambulatory Visit | Attending: Nurse Practitioner | Admitting: Nurse Practitioner

## 2022-07-13 ENCOUNTER — Encounter (HOSPITAL_BASED_OUTPATIENT_CLINIC_OR_DEPARTMENT_OTHER): Payer: Self-pay

## 2022-07-13 DIAGNOSIS — R109 Unspecified abdominal pain: Secondary | ICD-10-CM | POA: Diagnosis present

## 2022-07-13 DIAGNOSIS — R634 Abnormal weight loss: Secondary | ICD-10-CM | POA: Insufficient documentation

## 2022-07-13 MED ORDER — IOHEXOL 350 MG/ML SOLN
100.0000 mL | Freq: Once | INTRAVENOUS | Status: AC | PRN
  Administered 2022-07-13: 100 mL via INTRAVENOUS

## 2022-07-14 ENCOUNTER — Other Ambulatory Visit (HOSPITAL_COMMUNITY): Payer: Self-pay

## 2022-07-26 ENCOUNTER — Other Ambulatory Visit (HOSPITAL_COMMUNITY): Payer: Self-pay

## 2022-07-27 ENCOUNTER — Other Ambulatory Visit (INDEPENDENT_AMBULATORY_CARE_PROVIDER_SITE_OTHER): Payer: Self-pay | Admitting: Primary Care

## 2022-07-27 ENCOUNTER — Other Ambulatory Visit (HOSPITAL_COMMUNITY): Payer: Self-pay

## 2022-07-29 ENCOUNTER — Other Ambulatory Visit (HOSPITAL_COMMUNITY): Payer: Self-pay

## 2022-07-29 NOTE — Telephone Encounter (Signed)
Requested medication (s) are due for refill today: routing for approval  Requested medication (s) are on the active medication list: no  Last refill:  05/25/22  Future visit scheduled: yes  Notes to clinic:  Unable to refill per protocol, cannot delegate. Last refill from ED provider, patient request refill until OV with PCP 08/11/22. Patient comment: Im running out of medicine and I really need some to keep the inflammation down til I have my doctor appointment I'm afraid il have to miss work by being in pain if I don't have medicine to help me get by the day for the inflammation      Requested Prescriptions  Pending Prescriptions Disp Refills   predniSONE (DELTASONE) 10 MG tablet 98 tablet 0    Sig: Take 4 tablets (40 mg total) by mouth daily for 14 days, THEN 3 tablets (30 mg total) daily for 7 days, THEN 2 tablets (20 mg total) daily for 7 days, THEN 1 tablet (10 mg total) daily for 7 days.     Not Delegated - Endocrinology:  Oral Corticosteroids Failed - 07/27/2022  5:51 PM      Failed - This refill cannot be delegated      Failed - Manual Review: Eye exam for IOP if prolonged treatment      Failed - Last BP in normal range    BP Readings from Last 1 Encounters:  05/25/22 (!) 143/98         Failed - Bone Mineral Density or Dexa Scan completed in the last 2 years      Passed - Glucose (serum) in normal range and within 180 days    Glucose, Bld  Date Value Ref Range Status  07/07/2022 79 70 - 99 mg/dL Final         Passed - K in normal range and within 180 days    Potassium  Date Value Ref Range Status  07/07/2022 3.7 3.5 - 5.1 mEq/L Final         Passed - Na in normal range and within 180 days    Sodium  Date Value Ref Range Status  07/07/2022 141 135 - 145 mEq/L Final  05/19/2021 142 134 - 144 mmol/L Final         Passed - Valid encounter within last 6 months    Recent Outpatient Visits           2 months ago Gastritis and duodenitis   [facility] Grayce Sessions, NP   1 year ago Diarrhea, unspecified type   [facility], MD   1 year ago Essential hypertension   [facility], MD       Future Appointments             In 1 week Randa Evens, Kinnie Scales, NP [facility]

## 2022-08-01 ENCOUNTER — Other Ambulatory Visit: Payer: Self-pay

## 2022-08-01 ENCOUNTER — Encounter: Payer: Self-pay | Admitting: Nurse Practitioner

## 2022-08-01 ENCOUNTER — Other Ambulatory Visit (HOSPITAL_COMMUNITY): Payer: Self-pay

## 2022-08-01 ENCOUNTER — Other Ambulatory Visit (INDEPENDENT_AMBULATORY_CARE_PROVIDER_SITE_OTHER): Payer: BLUE CROSS/BLUE SHIELD

## 2022-08-01 ENCOUNTER — Ambulatory Visit (INDEPENDENT_AMBULATORY_CARE_PROVIDER_SITE_OTHER): Payer: BLUE CROSS/BLUE SHIELD | Admitting: Nurse Practitioner

## 2022-08-01 VITALS — BP 116/78 | HR 104 | Ht 65.5 in | Wt 135.4 lb

## 2022-08-01 DIAGNOSIS — K50919 Crohn's disease, unspecified, with unspecified complications: Secondary | ICD-10-CM

## 2022-08-01 DIAGNOSIS — K219 Gastro-esophageal reflux disease without esophagitis: Secondary | ICD-10-CM

## 2022-08-01 DIAGNOSIS — R109 Unspecified abdominal pain: Secondary | ICD-10-CM

## 2022-08-01 LAB — COMPREHENSIVE METABOLIC PANEL
ALT: 7 U/L (ref 0–35)
AST: 11 U/L (ref 0–37)
Albumin: 3.5 g/dL (ref 3.5–5.2)
Alkaline Phosphatase: 52 U/L (ref 39–117)
BUN: 9 mg/dL (ref 6–23)
CO2: 25 mEq/L (ref 19–32)
Calcium: 9.1 mg/dL (ref 8.4–10.5)
Chloride: 110 mEq/L (ref 96–112)
Creatinine, Ser: 0.75 mg/dL (ref 0.40–1.20)
GFR: 96.53 mL/min (ref >60.00)
Glucose, Bld: 77 mg/dL (ref 70–99)
Potassium: 4.3 mEq/L (ref 3.5–5.1)
Sodium: 141 mEq/L (ref 135–145)
Total Bilirubin: 0.4 mg/dL (ref 0.2–1.2)
Total Protein: 6.8 g/dL (ref 6.0–8.3)

## 2022-08-01 LAB — CBC WITH DIFFERENTIAL/PLATELET
Basophils Absolute: 0.1 10*3/uL (ref 0.0–0.1)
Basophils Relative: 0.6 % (ref 0.0–3.0)
Eosinophils Absolute: 0.2 10*3/uL (ref 0.0–0.7)
Eosinophils Relative: 2.6 % (ref 0.0–5.0)
HCT: 34.8 % — ABNORMAL LOW (ref 36.0–46.0)
Hemoglobin: 10.7 g/dL — ABNORMAL LOW (ref 12.0–15.0)
Lymphocytes Relative: 16.7 % (ref 12.0–46.0)
Lymphs Abs: 1.4 10*3/uL (ref 0.7–4.0)
MCHC: 30.8 g/dL (ref 30.0–36.0)
MCV: 81.3 fl (ref 78.0–100.0)
Monocytes Absolute: 0.6 10*3/uL (ref 0.1–1.0)
Monocytes Relative: 6.5 % (ref 3.0–12.0)
Neutro Abs: 6.3 10*3/uL (ref 1.4–7.7)
Neutrophils Relative %: 73.6 % (ref 43.0–77.0)
Platelets: 419 10*3/uL — ABNORMAL HIGH (ref 150.0–400.0)
RBC: 4.27 Mil/uL (ref 3.87–5.11)
RDW: 25.4 % — ABNORMAL HIGH (ref 11.5–15.5)
WBC: 8.5 10*3/uL (ref 4.0–10.5)

## 2022-08-01 LAB — SEDIMENTATION RATE: Sed Rate: 40 mm/hr — ABNORMAL HIGH (ref 0–20)

## 2022-08-01 LAB — C-REACTIVE PROTEIN: CRP: <1 mg/dL (ref 0.5–20.0)

## 2022-08-01 LAB — LIPASE: Lipase: 31 U/L (ref 11.0–59.0)

## 2022-08-01 MED ORDER — PREDNISONE 10 MG PO TABS: 10.0000 mg | ORAL_TABLET | Freq: Every day | ORAL | 0 refills | Status: DC

## 2022-08-01 MED ORDER — ONDANSETRON HCL 4 MG PO TABS: 4.0000 mg | ORAL_TABLET | Freq: Three times a day (TID) | ORAL | 0 refills | Status: DC | PRN

## 2022-08-01 MED ORDER — PROMETHAZINE HCL 12.5 MG PO TABS: 12.5000 mg | ORAL_TABLET | Freq: Four times a day (QID) | ORAL | 0 refills | Status: DC | PRN

## 2022-08-01 MED ORDER — PANTOPRAZOLE SODIUM 40 MG PO TBEC
40.0000 mg | DELAYED_RELEASE_TABLET | Freq: Every day | ORAL | 2 refills | Status: DC
Start: 2022-08-01 — End: 2022-11-10
  Filled 2022-08-01: qty 30, 30d supply, fill #0

## 2022-08-01 NOTE — Addendum Note (Signed)
Addended by: Rise Paganini on: 08/01/2022 02:57 PM   Modules accepted: Orders

## 2022-08-01 NOTE — Progress Notes (Signed)
08/01/2022 Dawn Walsh [id number] 1975/09/27   Chief Complaint: Crohn's disease, hospital follow up  History of Present Illness: Dawn Walsh is a 47 year old female with a past medical history of hypertension and iron deficiency anemia. S/P tubal ligation 2002. I initially saw Dawn Walsh in office 06/04/2021 for further evaluation regarding LUQ pain, diarrhea and weight loss. At that time, abdominal/pelvic CT scan was ordered but was not completed. A diagnostic EGD and colonoscopy were also ordered and were not done due to lack of insurance.  Since then, the patient obtained Medicaid.  She was admitted to the hospital 05/23/2022 due to having profound anemia with a hemoglobin level of 5.9 per labs obtained by her PCP with persistent N/V, left sided abdominal pain and weight loss. CTAP 05/23/2022 showed bowel wall thickening to a long segment of the distal small bowel to terminal ileum concerning for enteritis and under distention to the rectosigmoid colon with bowel wall thickening suggestive of early colitis. She underwent an EGD and colonoscopy by Dr. Barron Walsh 05/24/2022. The EGD was normal, no evidence of H. pylori or celiac disease. The colonoscopy identified 2 ulcers in the cecum, inflammation was present in the ileum and the entire examined colon was normal. Cecum biopsies were consistent with active colitis with ulceration and the biopsies of the terminal ileum are nonspecific. She was discharged home 05/25/2022 on a Prednisone taper.  Labs obtained prior to potential biologic therapy as follows: Labs 05/20/2022: Hepatitis C antibody nonreactive.  HIV nonreactive. Labs 05/25/2022: QuantiFERON gold: Indeterminate.  Hep B surface antigen hep B surface antibody nonreactive.  Nonreactive. CRP < 0.5. Sed rate 41.   She underwent CT enterography with contrast 07/13/2022 which showed long segment circumferential wall thickening, mucosal hyperenhancement and luminal narrowing involving  multiple distinct segments of the terminal ileum, involving at least 40 to 50 cm of the small bowel proximal to the ileocecal valve strongly suggestive of Crohn's disease.  No evidence of stricture, fistula or abscess.  Cholelithiasis without evidence of acute cholecystitis was noted.  She tapered Prednisone down to 10 mg every day. She continues to have sharp and achy LUQ pain which comes and goes and lasts for 15 to 20 minutes and is most notable around 2 PM and 7 to 8 PM.  Her LUQ pain also interferes with her sleep.  Eating any food worsens her LUQ pain.  She has nausea most days and vomited nonbloody bilious emesis x 6 over the past week.  She is passing 2 nonbloody bowel movements daily.  She took ibuprofen 200 mg 3 tabs daily x 2 months ( approximately from March through May 2024) due to having headaches.  She remains on Pantoprazole 40 mg daily.  GI PROCEDURES:   EGD 05/24/2022: - Normal esophagus.  - Normal stomach. Biopsied.  - Gastroesophageal flap valve classified as Hill Grade III (minimal fold, loose to endoscope, hiatal hernia likely).  - Normal examined duodenum. Biopsied  Colonoscopy 05/24/2022: - Perianal skin tags found on perianal exam.  - Two ulcers in the cecum. Biopsied. - The entire examined colon is normal. -  Inflammation was found in the ileum rule out Crohn's disease. Biopsied. -  Non-bleeding internal hemorrhoids.  -  Anal papilla(e) were hypertrophied.  A. DUODENUM, BIOPSY: Duodenal mucosa with preserved villoglandular architecture without increased intraepithelial lymphocytes or evidence of active inflammation. No evidence of gluten sensitive enteropathy.  B. STOMACH, BIOPSY:      Gastric antral / oxyntic mucosa with focal mild chronic inactive  gastritis.      No H. pylori identified on HE.  C. ILEUM, BIOPSY:      Ileal mucosa with intact villoglandular architecture without intraepithelial lymphocytosis or acute inflammation.      Negative for activity,  chronicity, granuloma, dysplasia or malignancy.  D. COLON, CECUM, BIOPSY:      Active colitis with ulceration, purulent exudates and focal mild crypt architectural distortion.      Negative for granuloma, dysplasia or malignancy.      See comment.  COMMENT:  D. The specimen demonstrates fragments of colonic mucosa with cryptitis, crypt abscess and ulceration. There is mild crypt architectural distortion without significant basilar lymphoplasmacytosis or pyloric gland metaplasia. The etiological consideration includes infection, medication effect, self-limiting colitis, et al. Dawn Walsh the last, a early stage of inflammatory bowel disease cannot be entirely excluded. Correlation with clinical information is recommended.  Current Outpatient Medications on File Prior to Visit  Medication Sig Dispense Refill   cyanocobalamin (VITAMIN B12) 250 MCG tablet Take 1 tablet (250 mcg total) by mouth daily. (Patient taking differently: Take 250 mcg by mouth daily. Gummies)     pantoprazole (PROTONIX) 40 MG tablet Take 1 tablet (40 mg total) by mouth daily. 60 tablet 0   No current facility-administered medications on file prior to visit.   Allergies  Allergen Reactions   Morphine And Codeine Itching   Current Medications, Allergies, Past Medical History, Past Surgical History, Family History and Social History were reviewed in [employer] record.  Review of Systems:   Constitutional: + 50lb weight loss.  Respiratory: Negative for shortness of breath.   Cardiovascular: Negative for chest pain, palpitations and leg swelling.  Gastrointestinal: See HPI.  Musculoskeletal: Negative for back pain or muscle aches.  Neurological: + Headaches.  Physical Exam: BP 116/78 (BP Location: Left Arm, Patient Position: Sitting, Cuff Size: Normal)   Pulse (!) 104   Ht 5' 5.5" (1.664 Walsh) Comment: height measured without shoes  Wt 135 lb 6 oz (61.4 kg)   LMP 07/30/2022   BMI 22.18 kg/Walsh   General: 47 year old female in no acute distress. Head: Normocephalic and atraumatic. Eyes: No scleral icterus. Conjunctiva pink . Ears: Normal auditory acuity. Mouth: Dentition intact. No ulcers or lesions.  Lungs: Clear throughout to auscultation. Heart: Regular rate and rhythm, no murmur. Abdomen: Soft, nondistended. Mild tenderness to the LUQ, epigastric, periumbilical and throughout the lower abdomen without rebound or guarding. No masses or hepatomegaly. Normal bowel sounds x 4 quadrants.  Rectal: Deferred.  Musculoskeletal: Symmetrical with no gross deformities. Extremities: No edema. Neurological: Alert oriented x 4. No focal deficits.  Psychological: Alert and cooperative. Normal mood and affect  Assessment and Recommendations:  47 year old female with chronic N/V, LUQ pain and weight loss. Admitted to the hospital 05/23/2022. CTAP 05/23/2022 showed bowel wall thickening to a long segment of the distal small bowel to the terminal ileum concerning for enteritis and under distention to the rectosigmoid colon with bowel wall thickening suggestive of early colitis. S/P EGD 05/24/2022 was normal. Colonoscopy 05/24/2022 identified 2 ulcers in the cecum, inflammation was present in the ileum and the entire examined colon was normal.  Biopsies of the cecum were consistent with active colitis with ulceration and the biopsies of the terminal ileum were nonspecific.  She was discharged home 05/25/2022 on a Prednisone taper. Outpatient CT enterography with contrast 07/13/2022 showed long segment circumferential wall thickening, mucosal hyperenhancement and luminal narrowing involving multiple distinct segments of the terminal ileum, involving  at least 40 to 50 cm of the small bowel proximal to the ileocecal valve strongly suggestive of Crohn's disease.  Quatiferon gold level was indeterminate. -Ondansetron 4 mg ODT 1 tab dissolve on tongue every 8 hours as needed -Promethazine 12.5 mg 1 tab p.o. twice  daily as needed, patient informed may cause drowsiness -Push fluids, Ensure or boost 1 to 2 cans daily, diet as tolerated -Continue Pantoprazole 40 mg daily -Continue Prednisone 10 mg 1 tab p.o. daily for now, # 30, no refills -Case discussed with Dr. Lavon Paganini -Initial Infliximab authorization -I discussed the benefits and risk of Infliximab including risk of TB reactivation, increased risk for viral, bacterial and fungal infections as well as hepatosplenic T-cell lymphoma. 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

## 2022-08-01 NOTE — Patient Instructions (Addendum)
Your provider has requested that you go to the basement level for lab work before leaving today. Press "B" on the elevator. The lab is located at the first door on the left as you exit the elevator.  Further Prednisone instructions to be provided after lab results reviewed.  Due to recent changes in healthcare laws, you may see the results of your imaging and laboratory studies on MyChart before your provider has had a chance to review them.  We understand that in some cases there may be results that are confusing or concerning to you. Not all laboratory results come back in the same time frame and the provider may be waiting for multiple results in order to interpret others.  Please give [place] 48 hours in order for your provider to thoroughly review all the results before contacting the office for clarification of your results.   Thank you for trusting me with your gastrointestinal care!   Alcide Evener, CRNP

## 2022-08-01 NOTE — Telephone Encounter (Signed)
Dawn Walsh, not sure why there is a wait for physician approval. Ok to send in RX for Pantoprazole 40mg  one po every day # 30, 2 refills. Also, see cc chart msg on this patient regarding pls initiate authorization for Infliximab/Remicade. THX.

## 2022-08-02 ENCOUNTER — Other Ambulatory Visit (HOSPITAL_COMMUNITY): Payer: Self-pay

## 2022-08-02 ENCOUNTER — Other Ambulatory Visit: Payer: Self-pay | Admitting: Nurse Practitioner

## 2022-08-02 LAB — HEPATITIS B CORE ANTIBODY, TOTAL: Hep B Core Total Ab: NONREACTIVE

## 2022-08-02 NOTE — Telephone Encounter (Signed)
Spoke with pt.  Pt stated that she is going after work to pick up medication: Pt verbalized understanding with all questions answered.

## 2022-08-05 ENCOUNTER — Telehealth: Payer: Self-pay

## 2022-08-05 ENCOUNTER — Encounter: Payer: Self-pay | Admitting: Nurse Practitioner

## 2022-08-05 ENCOUNTER — Other Ambulatory Visit: Payer: Self-pay

## 2022-08-05 DIAGNOSIS — K50919 Crohn's disease, unspecified, with unspecified complications: Secondary | ICD-10-CM | POA: Insufficient documentation

## 2022-08-05 NOTE — Telephone Encounter (Signed)
Arnaldo Natal, NP  Napoleon Form, MD; Emeline Darling, RN  Dr. Lavon Paganini, see colonoscopy biopsy results and Dr. Frankey Shown notes as he considered repeating a colonoscopy after she was on Prednisone for a while to obtain better small bowel biopsies? Let me know you recommendations and I will contact patient.  Viviann Spare, pls initiate Infliximab authorization for small bowel Crohn's disease. THX

## 2022-08-05 NOTE — Telephone Encounter (Signed)
Treatment plan has been submitted for Infliximab with [facility]. Plan was discussed with patient at time of OV with Jill Side, NP.

## 2022-08-05 NOTE — Addendum Note (Signed)
Addended by: Sharyon Medicus H on: 08/05/2022 10:43 AM   Modules accepted: Orders

## 2022-08-08 ENCOUNTER — Telehealth: Payer: Self-pay | Admitting: Nurse Practitioner

## 2022-08-08 NOTE — Telephone Encounter (Signed)
Pt made aware of Alcide Evener NP recommendations: Pt stated that she will come by this Friday to have labs drawn and that she cannot come prior to that. Reminder placed in EPIC for this Friday to ensure that she has labs drawn.  Secure chat sent to Amalia Greenhouse at infusion center to ensure that pt cannot start Remicade until repeat Quantiferon gold level reviewed.

## 2022-08-08 NOTE — Telephone Encounter (Signed)
 Left message for pt to call back  °

## 2022-08-08 NOTE — Telephone Encounter (Signed)
Dr. Lavon Paganini, Remicade authorization in process. Remicade orders signed in order to proceed with authorization process. Pls verify if ok to start Remicade once authorization received and once Quantiferon gold level reviewed.   Viviann Spare and Glendora Score, pls keep me updated regarding Remicade authorization.    Please call the patient today and remind her to come to our lab to have Quantiferon gold level done.  Patient cannot start Remicade until repeat Quantiferon gold level reviewed.   THX.

## 2022-08-08 NOTE — Telephone Encounter (Signed)
 Spoke with pt.  Documented in phone note: Pt verbalized understanding with all questions answered.

## 2022-08-10 ENCOUNTER — Other Ambulatory Visit (HOSPITAL_COMMUNITY): Payer: Self-pay

## 2022-08-11 ENCOUNTER — Other Ambulatory Visit (HOSPITAL_COMMUNITY): Payer: Self-pay

## 2022-08-11 ENCOUNTER — Encounter (INDEPENDENT_AMBULATORY_CARE_PROVIDER_SITE_OTHER): Payer: Self-pay

## 2022-08-11 ENCOUNTER — Ambulatory Visit (INDEPENDENT_AMBULATORY_CARE_PROVIDER_SITE_OTHER): Payer: BLUE CROSS/BLUE SHIELD | Admitting: Primary Care

## 2022-08-11 ENCOUNTER — Telehealth (INDEPENDENT_AMBULATORY_CARE_PROVIDER_SITE_OTHER): Payer: Self-pay | Admitting: Primary Care

## 2022-08-11 NOTE — Progress Notes (Unsigned)
[facility] [phone]   [phone]  INITIAL CONSULT NOTE  Patient Care Team: Grayce Sessions, NP as PCP - General (Internal Medicine)  CHIEF COMPLAINTS/PURPOSE OF CONSULTATION:  Iron deficiency anemia.   HISTORY OF PRESENTING ILLNESS:  Dawn Walsh 47 y.o. female with medical history significant for hypertension and newly diagnosed Crohn's disease presents to the hematology clinic for iron deficiency anemia. She is unaccompanied for this visit.   On review of labs from 07/07/2022, there is evidence of iron deficiency with serum ion 22, saturation 4.4%, ferritin 4.1, TIBC 502.6. CBC from 08/01/2022 showed anemia with Hgb 10.7, MCV 81.3. Plt 419K.   On exam today, Dawn Walsh reports that she has worsening fatigue that impacts her ADLs. She reports persistent nausea with approximately 3-4 episodes of vomiting per week. She takes zofran as needed with improvement. She denies any bowel habit changes including recurrent episodes of diarrhea or constipation. She does have monthly menstrual cycles last 5 days with 2 days of heavy bleeding. She denies any other signs of bleeding or bruising. She reports some shortness of breath with exertion and dizzy spells. She is craving ice and more recently craving corn starch. She denies fevers, chills, sweats, chest pain, cough or headaches. She has no other complaints. Rest of the ROS is below.   MEDICAL HISTORY:  Past Medical History:  Diagnosis Date   Anemia 06/04/2021   Hypertension     SURGICAL HISTORY: Past Surgical History:  Procedure Laterality Date   BIOPSY  05/24/2022   Procedure: BIOPSY;  Surgeon: Shellia Cleverly, DO;  Location: [facility] ENDOSCOPY;  Service: Gastroenterology;;   COLONOSCOPY N/A 05/24/2022   Procedure: COLONOSCOPY;  Surgeon: Shellia Cleverly, DO;  Location: [facility] ENDOSCOPY;  Service: Gastroenterology;  Laterality: N/A;   ESOPHAGOGASTRODUODENOSCOPY N/A 05/24/2022   Procedure:  ESOPHAGOGASTRODUODENOSCOPY (EGD);  Surgeon: Shellia Cleverly, DO;  Location[facility] ENDOSCOPY;  Service: Gastroenterology;  Laterality: N/A;   TUBAL LIGATION  04/09/2000    SOCIAL HISTORY: Social History   Socioeconomic History   Marital status: Single    Spouse name: Not on file   Number of children: Not on file   Years of education: Not on file   Highest education level: Not on file  Occupational History   Not on file  Tobacco Use   Smoking status: Some Days    Types: Cigars   Smokeless tobacco: Not on file  Vaping Use   Vaping status: Never Used  Substance and Sexual Activity   Alcohol use: No   Drug use: Yes    Frequency: 6.0 times per week    Types: Marijuana    Comment: every other day   Sexual activity: Yes    Birth control/protection: None  Other Topics Concern   Not on file  Social History Narrative   Not on file   Social Determinants of Health   Financial Resource Strain: Not on file  Food Insecurity: Not on file  Transportation Needs: Not on file  Physical Activity: Not on file  Stress: Not on file  Social Connections: Unknown (05/24/2021)   Received from [facility]   Social Network    Social Network: Not on file  Intimate Partner Violence: Unknown (04/30/2021)   Received from [facility]   HITS    Physically Hurt: Not on file    Insult or Talk Down To: Not on file    Threaten Physical Harm: Not on file    Scream or Curse: Not on file  FAMILY HISTORY: Family History  Problem Relation Age of Onset   Diabetes Mother    Breast cancer Mother    Colon cancer Neg Hx    Stomach cancer Neg Hx     ALLERGIES:  is allergic to morphine and codeine.  MEDICATIONS:  Current Outpatient Medications  Medication Sig Dispense Refill   cyanocobalamin (VITAMIN B12) 250 MCG tablet Take 1 tablet (250 mcg total) by mouth daily. (Patient taking differently: Take 250 mcg by mouth daily. Gummies)     ondansetron (ZOFRAN) 4 MG tablet Take 1 tablet (4 mg total) by  mouth every 8 (eight) hours as needed for nausea or vomiting. 20 tablet 0   pantoprazole (PROTONIX) 40 MG tablet Take 1 tablet (40 mg total) by mouth daily. 30 tablet 2   predniSONE (DELTASONE) 10 MG tablet Take 1 tablet (10 mg total) by mouth daily. 30 tablet 0   promethazine (PHENERGAN) 12.5 MG tablet Take 1 tablet (12.5 mg total) by mouth every 6 (six) hours as needed for nausea or vomiting. 20 tablet 0   pantoprazole (PROTONIX) 40 MG tablet Take 1 tablet (40 mg total) by mouth daily. 60 tablet 0   No current facility-administered medications for this visit.    REVIEW OF SYSTEMS:   Constitutional: ( - ) fevers, ( - )  chills , ( - ) night sweats Eyes: ( - ) blurriness of vision, ( - ) double vision, ( - ) watery eyes Ears, nose, mouth, throat, and face: ( - ) mucositis, ( - ) sore throat Respiratory: ( - ) cough, (+ ) dyspnea, ( - ) wheezes Cardiovascular: ( - ) palpitation, ( - ) chest discomfort, ( - ) lower extremity swelling Gastrointestinal:  (+ ) nausea, ( - ) heartburn, ( - ) change in bowel habits Skin: ( - ) abnormal skin rashes Lymphatics: ( - ) new lymphadenopathy, ( - ) easy bruising Neurological: ( - ) numbness, ( - ) tingling, ( - ) new weaknesses Behavioral/Psych: ( - ) mood change, ( - ) new changes  All other systems were reviewed with the patient and are negative.  PHYSICAL EXAMINATION: ECOG PERFORMANCE STATUS: 1 - Symptomatic but completely ambulatory  Vitals:   08/12/22 0927  BP: 133/88  Pulse: 97  Resp: 18  Temp: 100 F (37.8 C)  SpO2: 100%   Filed Weights   08/12/22 0927  Weight: 135 lb (61.2 kg)    GENERAL: well appearing female in NAD  SKIN: skin color, texture, turgor are normal, no rashes or significant lesions EYES: conjunctiva are pink and non-injected, sclera clear OROPHARYNX: no exudate, no erythema; lips, buccal mucosa, and tongue normal  LUNGS: clear to auscultation and percussion with normal breathing effort HEART: regular rate & rhythm  and no murmurs and no lower extremity edema Musculoskeletal: no cyanosis of digits and no clubbing  PSYCH: alert & oriented x 3, fluent speech NEURO: no focal motor/sensory deficits  LABORATORY DATA:  I have reviewed the data as listed    Latest Ref Rng & Units 08/12/2022   10:13 AM 08/01/2022    9:34 AM 07/07/2022    9:30 AM  CBC  WBC 4.0 - 10.5 K/uL 8.2  8.5  6.7   Hemoglobin 12.0 - 15.0 g/dL 9.8  62.1  9.9   Hematocrit 36.0 - 46.0 % 32.0  34.8  32.2   Platelets 150 - 400 K/uL 413  419.0  383.0        Latest Ref Rng & Units 08/12/2022  10:13 AM 08/01/2022    9:34 AM 07/07/2022    9:30 AM  CMP  Glucose 70 - 99 mg/dL 75  77  79   BUN 6 - 20 mg/dL 8  9  11    Creatinine 0.44 - 1.00 mg/dL 9.52  8.41  3.24   Sodium 135 - 145 mmol/L 140  141  141   Potassium 3.5 - 5.1 mmol/L 3.5  4.3  3.7   Chloride 98 - 111 mmol/L 109  110  109   CO2 22 - 32 mmol/L 27  25  26    Calcium 8.9 - 10.3 mg/dL 9.0  9.1  8.7   Total Protein 6.5 - 8.1 g/dL 6.4  6.8  6.5   Total Bilirubin 0.3 - 1.2 mg/dL 0.4  0.4  0.3   Alkaline Phos 38 - 126 U/L 53  52  50   AST 15 - 41 U/L 10  11  12    ALT 0 - 44 U/L 8  7  9      RADIOGRAPHIC STUDIES: I have personally reviewed the radiological images as listed and agreed with the findings in the report. CT ENTERO ABD/PELVIS W CONTAST  Result Date: 07/14/2022 CLINICAL DATA:  History of enteritis, unintentional weight loss, bloating, abdominal pain EXAM: CT ABDOMEN AND PELVIS WITH CONTRAST (ENTEROGRAPHY) TECHNIQUE: Multidetector CT of the abdomen and pelvis during bolus administration of intravenous contrast. Negative oral contrast was given. RADIATION DOSE REDUCTION: This exam was performed according to the departmental dose-optimization program which includes automated exposure control, adjustment of the mA and/or kV according to patient size and/or use of iterative reconstruction technique. CONTRAST:  OMNIPAQUE IOHEXOL 350 MG/ML SOLN COMPARISON:  05/23/2022 FINDINGS:  Lower chest: No acute abnormality. Hepatobiliary: No solid liver abnormality is seen. Gallstones. No gallbladder wall thickening, or biliary dilatation. Pancreas: Unremarkable. No pancreatic ductal dilatation or surrounding inflammatory changes. Spleen: Normal in size without significant abnormality. Adrenals/Urinary Tract: Adrenal glands are unremarkable. Kidneys are normal, without renal calculi, solid lesion, or hydronephrosis. Bladder is unremarkable. Stomach/Bowel: Stomach is within normal limits. Appendix appears normal. Long segment, circumferential wall thickening, mucosal hyperenhancement, and luminal narrowing involving multiple distinct segments of terminal ileum, involving at least 40-50 cm of the small bowel proximal to the ileocecal valve. The terminal ileum and ileocecal valve are mildly thickened and narrowed, involving the terminal 5 cm, and just proximal to this is an additional long, narrowed segment approximately 15 cm in length (series 2, image 62). There is however no overt stricturing or obstruction, nor evidence of fistula or abscess. Moderate burden of stool in the colon. Vascular/Lymphatic: No significant vascular findings are present. No enlarged abdominal or pelvic lymph nodes. Reproductive: Fibroid of the posterior fundus (series 7, image 65) Other: No abdominal wall hernia or abnormality. No ascites. Musculoskeletal: No acute or significant osseous findings. IMPRESSION: 1. Long segment circumferential wall thickening, mucosal hyperenhancement, and luminal narrowing involving multiple distinct segments of terminal ileum, involving at least 40-50 cm of the small bowel proximal to the ileocecal valve. Findings are similar to prior examination dated 05/23/2022 and strongly suggestive of Crohn's disease with active inflammation. 2. No evidence overt stricturing or obstruction, nor evidence of complicating fistula or abscess. 3. Cholelithiasis without evidence of acute cholecystitis.  Electronically Signed   By: Jearld Lesch M.D.   On: 07/14/2022 22:15    ASSESSMENT & PLAN Dawn Walsh is a 47 y.o. female who presents to the hematology clinic for iron deficiency anemia.   # Iron Deficiency  Anemia 2/2 to GYN Bleeding and Crohn's disease -- Findings are consistent with iron deficiency anemia secondary to patient's menorrhagia and malabsorption from Crohn's disease.  --Encouraged her to follow-up with OB/GYN for better control of her menstrual cycles. Referral sent to gynecology --We will confirm iron deficiency anemia by ordering iron panel and ferritin as well as reticulocytes, CBC, and CMP --Unable to tolerate PO iron due to GI discomfort.  --We will plan to proceed with IV iron therapy in order to help bolster the patient's blood counts --Plan for return to clinic in 8 weeks with repeat labs.   Orders Placed This Encounter  Procedures   CBC with Differential (Cancer Center Only)    Standing Status:   Future    Number of Occurrences:   1    Standing Expiration Date:   08/11/2023   CMP (Cancer Center only)    Standing Status:   Future    Number of Occurrences:   1    Standing Expiration Date:   08/11/2023   Ferritin    Standing Status:   Future    Number of Occurrences:   1    Standing Expiration Date:   08/11/2023   Iron and Iron Binding Capacity ([facility] only)    Standing Status:   Future    Number of Occurrences:   1    Standing Expiration Date:   08/11/2023   Retic Panel    Standing Status:   Future    Number of Occurrences:   1    Standing Expiration Date:   08/11/2023    All questions were answered. The patient knows to call the clinic with any problems, questions or concerns.  I have spent a total of 60 minutes minutes of face-to-face and non-face-to-face time, preparing to see the patient, obtaining and/or reviewing separately obtained history, performing a medically appropriate examination, counseling and educating the patient, ordering  medications/tests/procedures, referring and communicating with other health care professionals, documenting clinical information in the electronic health record,and care coordination.   Georga Kaufmann, PA-C Department of Hematology/Oncology [facility] at [facility] Phone: [phone]  Patient was seen with Dr. Leonides Schanz  I have read the above note and personally examined the patient. I agree with the assessment and plan as noted above.  Briefly Dawn Walsh is a 47 year old female who presents for evaluation of iron deficiency anemia.  The patient has history of menorrhagia as well as Crohn's disease.  Given her inflammatory bowel disease I worry that she will not be able to adequately absorb p.o. iron therapy.  Would recommend proceeding with IV iron at this time.  Also provided patient with a list of iron rich foods, though p.o. absorption of iron is likely poor in the setting of her inflammatory bowel disease.  Recommend the patient return to clinic 4 to 6 weeks after last dose of IV iron in order to assure is adequately repleted her iron stores.   Ulysees Barns, MD Department of Hematology/Oncology [facility] at [facility] Phone: [phone] Pager: [phone] Email: [email or web address]

## 2022-08-11 NOTE — Telephone Encounter (Signed)
Left VM for pt to come in at 150pm on 08-11-22

## 2022-08-12 ENCOUNTER — Telehealth: Payer: Self-pay | Admitting: Pharmacy Technician

## 2022-08-12 ENCOUNTER — Other Ambulatory Visit: Payer: Self-pay | Admitting: Primary Care

## 2022-08-12 ENCOUNTER — Other Ambulatory Visit: Payer: Self-pay

## 2022-08-12 ENCOUNTER — Inpatient Hospital Stay: Payer: BLUE CROSS/BLUE SHIELD

## 2022-08-12 ENCOUNTER — Other Ambulatory Visit: Payer: BLUE CROSS/BLUE SHIELD

## 2022-08-12 ENCOUNTER — Inpatient Hospital Stay: Payer: BLUE CROSS/BLUE SHIELD | Attending: Physician Assistant | Admitting: Physician Assistant

## 2022-08-12 VITALS — BP 133/88 | HR 97 | Temp 100.0°F | Resp 18 | Ht 65.5 in | Wt 135.0 lb

## 2022-08-12 DIAGNOSIS — D500 Iron deficiency anemia secondary to blood loss (chronic): Secondary | ICD-10-CM | POA: Diagnosis present

## 2022-08-12 DIAGNOSIS — I10 Essential (primary) hypertension: Secondary | ICD-10-CM | POA: Insufficient documentation

## 2022-08-12 DIAGNOSIS — K50919 Crohn's disease, unspecified, with unspecified complications: Secondary | ICD-10-CM

## 2022-08-12 DIAGNOSIS — K219 Gastro-esophageal reflux disease without esophagitis: Secondary | ICD-10-CM

## 2022-08-12 DIAGNOSIS — Z803 Family history of malignant neoplasm of breast: Secondary | ICD-10-CM | POA: Insufficient documentation

## 2022-08-12 DIAGNOSIS — F17290 Nicotine dependence, other tobacco product, uncomplicated: Secondary | ICD-10-CM | POA: Diagnosis not present

## 2022-08-12 DIAGNOSIS — N920 Excessive and frequent menstruation with regular cycle: Secondary | ICD-10-CM | POA: Diagnosis not present

## 2022-08-12 DIAGNOSIS — D508 Other iron deficiency anemias: Secondary | ICD-10-CM

## 2022-08-12 DIAGNOSIS — K50918 Crohn's disease, unspecified, with other complication: Secondary | ICD-10-CM | POA: Insufficient documentation

## 2022-08-12 DIAGNOSIS — R109 Unspecified abdominal pain: Secondary | ICD-10-CM

## 2022-08-12 LAB — CBC WITH DIFFERENTIAL (CANCER CENTER ONLY)
Abs Immature Granulocytes: 0.03 10*3/uL (ref 0.00–0.07)
Basophils Absolute: 0.1 10*3/uL (ref 0.0–0.1)
Basophils Relative: 1 %
Eosinophils Absolute: 0.2 10*3/uL (ref 0.0–0.5)
Eosinophils Relative: 2 %
HCT: 32 % — ABNORMAL LOW (ref 36.0–46.0)
Hemoglobin: 9.8 g/dL — ABNORMAL LOW (ref 12.0–15.0)
Immature Granulocytes: 0 %
Lymphocytes Relative: 17 %
Lymphs Abs: 1.4 10*3/uL (ref 0.7–4.0)
MCH: 25.3 pg — ABNORMAL LOW (ref 26.0–34.0)
MCHC: 30.6 g/dL (ref 30.0–36.0)
MCV: 82.5 fL (ref 80.0–100.0)
Monocytes Absolute: 0.5 10*3/uL (ref 0.1–1.0)
Monocytes Relative: 6 %
Neutro Abs: 6.1 10*3/uL (ref 1.7–7.7)
Neutrophils Relative %: 74 %
Platelet Count: 413 10*3/uL — ABNORMAL HIGH (ref 150–400)
RBC: 3.88 MIL/uL (ref 3.87–5.11)
RDW: 19.6 % — ABNORMAL HIGH (ref 11.5–15.5)
WBC Count: 8.2 10*3/uL (ref 4.0–10.5)
nRBC: 0 % (ref 0.0–0.2)

## 2022-08-12 LAB — IRON AND IRON BINDING CAPACITY (CC-WL,HP ONLY)
Iron: 17 ug/dL — ABNORMAL LOW (ref 28–170)
Saturation Ratios: 3 % — ABNORMAL LOW (ref 10.4–31.8)
TIBC: 496 ug/dL — ABNORMAL HIGH (ref 250–450)
UIBC: 479 ug/dL — ABNORMAL HIGH (ref 148–442)

## 2022-08-12 LAB — CMP (CANCER CENTER ONLY)
ALT: 8 U/L (ref 0–44)
AST: 10 U/L — ABNORMAL LOW (ref 15–41)
Albumin: 3.5 g/dL (ref 3.5–5.0)
Alkaline Phosphatase: 53 U/L (ref 38–126)
Anion gap: 4 — ABNORMAL LOW (ref 5–15)
BUN: 8 mg/dL (ref 6–20)
CO2: 27 mmol/L (ref 22–32)
Calcium: 9 mg/dL (ref 8.9–10.3)
Chloride: 109 mmol/L (ref 98–111)
Creatinine: 0.64 mg/dL (ref 0.44–1.00)
GFR, Estimated: >60 mL/min (ref >60)
Glucose, Bld: 75 mg/dL (ref 70–99)
Potassium: 3.5 mmol/L (ref 3.5–5.1)
Sodium: 140 mmol/L (ref 135–145)
Total Bilirubin: 0.4 mg/dL (ref 0.3–1.2)
Total Protein: 6.4 g/dL — ABNORMAL LOW (ref 6.5–8.1)

## 2022-08-12 LAB — RETIC PANEL
Immature Retic Fract: 24.7 % — ABNORMAL HIGH (ref 2.3–15.9)
RBC.: 3.91 MIL/uL (ref 3.87–5.11)
Retic Count, Absolute: 49.7 10*3/uL (ref 19.0–186.0)
Retic Ct Pct: 1.3 % (ref 0.4–3.1)
Reticulocyte Hemoglobin: 21.1 pg — ABNORMAL LOW (ref >27.9)

## 2022-08-12 LAB — FERRITIN: Ferritin: 3 ng/mL — ABNORMAL LOW (ref 11–307)

## 2022-08-12 NOTE — Telephone Encounter (Addendum)
Auth Submission: DENIED Patient will need to be referred to another site. Site of care: Site of care: [facility] Payer: [employer] Medication & CPT/J Code(s) submitted: Feraheme (ferumoxytol) [id number] Route of submission (phone, fax, portal):  Phone # Fax # Auth type: Buy/Bill Units/visits requested: X2 Reference number:  Approval from:

## 2022-08-12 NOTE — Telephone Encounter (Signed)
Labs were collected today.

## 2022-08-12 NOTE — Telephone Encounter (Addendum)
Dr. Lavon Paganini, Lorain Childes note: updated note  Dawn Walsh has been denied due to medication is not covered in health plan.  You have the right to appeal or request a peer to peer.   Please call [phone] Ref: [id number].  Please advise  Auth Submission: DENIED Site of care: Site of care: [facility] Payer: [facility] community health Medication & CPT/J Code(s) submitted: Avsola (infliximab-axxq) [phone] Route of submission (phone, fax, portal): [phone] Phone # Fax # Auth type: Buy/Bill Units/visits requested: 5mg /kg q8wks Reference number: [id number] Approval from:

## 2022-08-14 ENCOUNTER — Encounter: Payer: Self-pay | Admitting: Physician Assistant

## 2022-08-14 ENCOUNTER — Encounter: Payer: Self-pay | Admitting: Gastroenterology

## 2022-08-14 NOTE — Addendum Note (Signed)
Addended by: Jeanie Sewer T on: 08/14/2022 05:38 PM   Modules accepted: Level of Service

## 2022-08-15 NOTE — Telephone Encounter (Signed)
Noted  

## 2022-08-15 NOTE — Telephone Encounter (Signed)
  Pt stated that she is having ongoing abdominal pain in  the LLQ. Pt stated that she is in constant  pain from what she sates  feels like inflammation: Ct scan 07/13/2022  Recently started on Prednisone 10 mg daily:  Awaiting scheduling for the Infusion of the Avsola at [facility] Infusion center: Please review and advise.

## 2022-08-16 LAB — QUANTIFERON-TB GOLD PLUS
Mitogen-NIL: 9.11 IU/mL
NIL: 0.04 IU/mL
QuantiFERON-TB Gold Plus: NEGATIVE
TB1-NIL: <0 IU/mL
TB2-NIL: <0 IU/mL

## 2022-08-17 NOTE — Telephone Encounter (Signed)
Please advise patient to increase prednisone dose to 20 mg daily, request infusion center to infusion for Avsola induction dose as soon as possible.  Once she receives the induction dose of Avsola, instruct patient to decrease prednisone by 5 mg every week.  Please send her prescription for prednisone.  Follow-up in office visit next available appointment with APP or me.

## 2022-08-18 ENCOUNTER — Ambulatory Visit (INDEPENDENT_AMBULATORY_CARE_PROVIDER_SITE_OTHER): Payer: BLUE CROSS/BLUE SHIELD

## 2022-08-18 ENCOUNTER — Other Ambulatory Visit: Payer: Self-pay

## 2022-08-18 VITALS — BP 146/89 | HR 90 | Temp 98.4°F | Resp 18 | Wt 132.4 lb

## 2022-08-18 DIAGNOSIS — D649 Anemia, unspecified: Secondary | ICD-10-CM

## 2022-08-18 MED ORDER — DIPHENHYDRAMINE HCL 25 MG PO CAPS
25.0000 mg | ORAL_CAPSULE | Freq: Once | ORAL | Status: AC
  Administered 2022-08-18: 25 mg via ORAL
  Filled 2022-08-18: qty 1

## 2022-08-18 MED ORDER — SODIUM CHLORIDE 0.9 % IV SOLN
510.0000 mg | Freq: Once | INTRAVENOUS | Status: AC
  Administered 2022-08-18: 510 mg via INTRAVENOUS
  Filled 2022-08-18: qty 17

## 2022-08-18 MED ORDER — PREDNISONE 10 MG PO TABS: 20.0000 mg | ORAL_TABLET | Freq: Every day | ORAL | 0 refills | Status: DC

## 2022-08-18 MED ORDER — ACETAMINOPHEN 325 MG PO TABS
650.0000 mg | ORAL_TABLET | Freq: Once | ORAL | Status: AC
  Administered 2022-08-18: 650 mg via ORAL
  Filled 2022-08-18: qty 2

## 2022-08-18 NOTE — Patient Instructions (Signed)
Ferumoxytol Injection What is this medication? FERUMOXYTOL (FER ue MOX i tol) treats low levels of iron in your body (iron deficiency anemia). Iron is a mineral that plays an important role in making red blood cells, which carry oxygen from your lungs to the rest of your body. This medicine may be used for other purposes; ask your health care provider or pharmacist if you have questions. COMMON BRAND NAME(S): Feraheme What should I tell my care team before I take this medication? They need to know if you have any of these conditions: Anemia not caused by low iron levels High levels of iron in the blood Magnetic resonance imaging (MRI) test scheduled An unusual or allergic reaction to iron, other medications, foods, dyes, or preservatives Pregnant or trying to get pregnant Breastfeeding How should I use this medication? This medication is injected into a vein. It is given by your care team in a hospital or clinic setting. Talk to your care team the use of this medication in children. Special care may be needed. Overdosage: If you think you have taken too much of this medicine contact a poison control center or emergency room at once. NOTE: This medicine is only for you. Do not share this medicine with others. What if I miss a dose? It is important not to miss your dose. Call your care team if you are unable to keep an appointment. What may interact with this medication? Other iron products This list may not describe all possible interactions. Give your health care provider a list of all the medicines, herbs, non-prescription drugs, or dietary supplements you use. Also tell them if you smoke, drink alcohol, or use illegal drugs. Some items may interact with your medicine. What should I watch for while using this medication? Visit your care team regularly. Tell your care team if your symptoms do not start to get better or if they get worse. You may need blood work done while you are taking this  medication. You may need to follow a special diet. Talk to your care team. Foods that contain iron include: whole grains/cereals, dried fruits, beans, or peas, leafy green vegetables, and organ meats (liver, kidney). What side effects may I notice from receiving this medication? Side effects that you should report to your care team as soon as possible: Allergic reactions--skin rash, itching, hives, swelling of the face, lips, tongue, or throat Low blood pressure--dizziness, feeling faint or lightheaded, blurry vision Shortness of breath Side effects that usually do not require medical attention (report to your care team if they continue or are bothersome): Flushing Headache Joint pain Muscle pain Nausea Pain, redness, or irritation at injection site This list may not describe all possible side effects. Call your doctor for medical advice about side effects. You may report side effects to FDA at 1-800-FDA-1088. Where should I keep my medication? This medication is given in a hospital or clinic. It will not be stored at home. NOTE: This sheet is a summary. It may not cover all possible information. If you have questions about this medicine, talk to your doctor, pharmacist, or health care provider.  2024 Elsevier/Gold Standard (2022-06-17 00:00:00)

## 2022-08-18 NOTE — Progress Notes (Signed)
Diagnosis: Acute Anemia  Provider:  Chilton Greathouse MD  Procedure: IV Infusion  IV Type: Peripheral, IV Location: R hand  Feraheme (Ferumoxytol), Dose: 510 mg  Infusion Start Time: 1155  Infusion Stop Time: 1212  Post Infusion IV Care: Observation period completed and Peripheral IV Discontinued  Discharge: Condition: Good, Destination: Home . AVS Provided  Performed by:  Wyvonne Lenz, RN

## 2022-08-24 NOTE — Telephone Encounter (Signed)
Kim, are you able to see what happened with coverage for iron? Thanks

## 2022-08-24 NOTE — Telephone Encounter (Signed)
Spoke with patient and explained reason for denial.  Patient will call insurance for further details for denial.  Awaiting peer to peer. Phone: [phone].

## 2022-08-25 ENCOUNTER — Other Ambulatory Visit: Payer: Self-pay

## 2022-08-25 ENCOUNTER — Other Ambulatory Visit: Payer: Self-pay | Admitting: Physician Assistant

## 2022-08-25 ENCOUNTER — Ambulatory Visit: Payer: BLUE CROSS/BLUE SHIELD

## 2022-08-25 MED ORDER — PROMETHAZINE HCL 12.5 MG PO TABS: 12.5000 mg | ORAL_TABLET | Freq: Four times a day (QID) | ORAL | 1 refills | Status: DC | PRN

## 2022-08-25 NOTE — Telephone Encounter (Signed)
Dawn Walsh, ok to refill Promethazine 12.5mg  one tab po Q 6 hrs PRN N/V # 30, 1 additional refill.   Dr. Lavon Paganini, I am adding you to this message so you are in the loop. Looks like a separate message was sent to your regarding the appeal process for Avsola.

## 2022-08-25 NOTE — Telephone Encounter (Signed)
Thank you :)

## 2022-08-25 NOTE — Telephone Encounter (Signed)
Karena Addison, Thanks, we will schedule patient as soon as possible. Selena Batten

## 2022-08-31 ENCOUNTER — Ambulatory Visit: Payer: BLUE CROSS/BLUE SHIELD

## 2022-09-02 NOTE — Telephone Encounter (Signed)
Kaira, Dr. Lavon Paganini has been our of the office this week. Pls initiate peer to peer review regarding Avsola.  THX.

## 2022-09-06 ENCOUNTER — Encounter: Payer: Self-pay | Admitting: Physician Assistant

## 2022-09-06 ENCOUNTER — Encounter: Payer: Self-pay | Admitting: Gastroenterology

## 2022-09-07 ENCOUNTER — Telehealth: Payer: Self-pay

## 2022-09-07 NOTE — Telephone Encounter (Signed)
The patient's insurance will not cover Avsola or Remicade. Not clear what they do cover. Do you want to change the biologic to another bio-similar? See 08/01/22 office note.

## 2022-09-09 NOTE — Telephone Encounter (Signed)
Can infusion center help investigate her benefits and see which biosimilar is covered for infliximab infusion?  Thank you

## 2022-09-12 NOTE — Telephone Encounter (Signed)
Hello, Dawn Walsh or Dawn Walsh are you able to tell us which bio similar is covered for Infliximab infusion? Inflectra?

## 2022-09-13 ENCOUNTER — Ambulatory Visit: Payer: Medicaid Other

## 2022-09-13 ENCOUNTER — Other Ambulatory Visit: Payer: Self-pay

## 2022-09-13 VITALS — BP 136/87 | HR 99 | Temp 98.6°F | Resp 16 | Ht 65.5 in | Wt 140.0 lb

## 2022-09-13 DIAGNOSIS — D649 Anemia, unspecified: Secondary | ICD-10-CM

## 2022-09-13 DIAGNOSIS — K50919 Crohn's disease, unspecified, with unspecified complications: Secondary | ICD-10-CM

## 2022-09-13 MED ORDER — ACETAMINOPHEN 325 MG PO TABS
650.0000 mg | ORAL_TABLET | Freq: Once | ORAL | Status: AC
  Administered 2022-09-13: 650 mg via ORAL
  Filled 2022-09-13: qty 2

## 2022-09-13 MED ORDER — SODIUM CHLORIDE 0.9 % IV SOLN
200.0000 mg | Freq: Once | INTRAVENOUS | Status: AC
  Administered 2022-09-13: 200 mg via INTRAVENOUS
  Filled 2022-09-13: qty 10

## 2022-09-13 MED ORDER — DIPHENHYDRAMINE HCL 25 MG PO CAPS
25.0000 mg | ORAL_CAPSULE | Freq: Once | ORAL | Status: AC
  Administered 2022-09-13: 25 mg via ORAL
  Filled 2022-09-13: qty 1

## 2022-09-13 NOTE — Progress Notes (Signed)
Diagnosis: Acute Anemia  Provider:  Chilton Greathouse MD  Procedure: IV Infusion  IV Type: Peripheral, IV Location: R Antecubital  Venofer (Iron Sucrose), Dose: 200 mg  Infusion Start Time: 1040  Infusion Stop Time: 1057  Post Infusion IV Care: Observation period completed and Peripheral IV Discontinued  Discharge: Condition: Stable, Destination: Home . AVS Provided  Performed by:  Wyvonne Lenz, RN

## 2022-09-13 NOTE — Telephone Encounter (Signed)
Orders for Remicade 5mg /kg induction per protocol entered through [facility] Infusion Navigator.

## 2022-09-13 NOTE — Telephone Encounter (Signed)
Its rare that the Avsola has been denied and unfortunately Infectra is not cost effective.  I have submitted an auth for the Remicade and will f/u once I have a response from the insurance.  Please enter a new treatment plan for the Remicade.  Selena Batten

## 2022-09-13 NOTE — Telephone Encounter (Addendum)
Auth Submission: APPROVED Site of care: Site of care: [facility] Payer: [employer] MEDICA[employer] Medication & CPT/J Code(s) submitted: Remicade (Infliximab) J1745 Route of submission (phone, fax, portal):  Phone #[phone] Fax #[phone] Auth type: Buy/Bill PB Units/visits requested: 2 doses Reference number: [id number] Approval from: 09/13/22 - 10/24/22  Insurance will term on 10/24/22. Patient will need verify new insurance. New Berkley Harvey will be needed

## 2022-09-13 NOTE — Telephone Encounter (Signed)
Lanora Manis, Remicade has been approved and we will schedule patient as soon as possible.

## 2022-09-14 ENCOUNTER — Other Ambulatory Visit: Payer: Self-pay

## 2022-09-14 MED ORDER — SODIUM CHLORIDE 0.9 % IV SOLN: 5.0000 mg/kg | INTRAVENOUS | Status: AC

## 2022-09-14 NOTE — Telephone Encounter (Signed)
Documentation in phone notes

## 2022-09-23 ENCOUNTER — Ambulatory Visit: Payer: Medicaid Other

## 2022-09-23 VITALS — BP 148/90 | HR 91 | Temp 98.5°F | Resp 16 | Ht 65.5 in | Wt 139.6 lb

## 2022-09-23 DIAGNOSIS — D649 Anemia, unspecified: Secondary | ICD-10-CM | POA: Diagnosis not present

## 2022-09-23 DIAGNOSIS — K50919 Crohn's disease, unspecified, with unspecified complications: Secondary | ICD-10-CM

## 2022-09-23 MED ORDER — ACETAMINOPHEN 325 MG PO TABS
650.0000 mg | ORAL_TABLET | Freq: Once | ORAL | Status: AC
  Administered 2022-09-23: 650 mg via ORAL
  Filled 2022-09-23: qty 2

## 2022-09-23 MED ORDER — DIPHENHYDRAMINE HCL 25 MG PO CAPS: 25.0000 mg | ORAL_CAPSULE | Freq: Once | ORAL | Status: DC

## 2022-09-23 MED ORDER — SODIUM CHLORIDE 0.9 % IV SOLN
200.0000 mg | Freq: Once | INTRAVENOUS | Status: AC
  Administered 2022-09-23: 200 mg via INTRAVENOUS
  Filled 2022-09-23: qty 10

## 2022-09-23 NOTE — Progress Notes (Signed)
Diagnosis: Iron Deficiency Anemia  Provider:  Chilton Greathouse MD  Procedure: IV Infusion  IV Type: Peripheral, IV Location: R Antecubital  Venofer (Iron Sucrose), Dose: 200 mg  Infusion Start Time: 1005  Infusion Stop Time: 1020  Post Infusion IV Care: Patient declined observation and Peripheral IV Discontinued  Discharge: Condition: Good, Destination: Home . AVS Declined  Performed by:  Loney Hering, LPN

## 2022-09-28 ENCOUNTER — Ambulatory Visit (INDEPENDENT_AMBULATORY_CARE_PROVIDER_SITE_OTHER): Payer: Medicaid Other

## 2022-09-28 VITALS — BP 138/89 | HR 90 | Temp 98.1°F | Resp 16 | Ht 65.5 in | Wt 139.0 lb

## 2022-09-28 DIAGNOSIS — K50919 Crohn's disease, unspecified, with unspecified complications: Secondary | ICD-10-CM | POA: Diagnosis not present

## 2022-09-28 MED ORDER — SODIUM CHLORIDE 0.9 % IV SOLN
5.0000 mg/kg | Freq: Once | INTRAVENOUS | Status: AC
  Administered 2022-09-28: 300 mg via INTRAVENOUS
  Filled 2022-09-28: qty 0

## 2022-09-28 MED ORDER — METHYLPREDNISOLONE SODIUM SUCC 40 MG IJ SOLR
40.0000 mg | Freq: Once | INTRAMUSCULAR | Status: AC
  Administered 2022-09-28: 40 mg via INTRAVENOUS
  Filled 2022-09-28: qty 1

## 2022-09-28 MED ORDER — DIPHENHYDRAMINE HCL 25 MG PO CAPS
25.0000 mg | ORAL_CAPSULE | Freq: Once | ORAL | Status: AC
  Administered 2022-09-28: 25 mg via ORAL
  Filled 2022-09-28: qty 1

## 2022-09-28 MED ORDER — ACETAMINOPHEN 325 MG PO TABS
650.0000 mg | ORAL_TABLET | Freq: Once | ORAL | Status: AC
  Administered 2022-09-28: 650 mg via ORAL
  Filled 2022-09-28: qty 2

## 2022-09-28 NOTE — Patient Instructions (Signed)
 Infliximab Injection What is this medication? INFLIXIMAB (in FLIX i mab) treats autoimmune conditions, such as psoriasis, arthritis, Crohn's disease, and ulcerative colitis. It works by slowing down an overactive immune system. It belongs to a group of medications called TNF inhibitors. It is a monoclonal antibody. This medicine may be used for other purposes; ask your health care provider or pharmacist if you have questions. COMMON BRAND NAME(S): AVSOLA, INFLECTRA, IXIFI, Remicade, RENFLEXIS, Zymfentra What should I tell my care team before I take this medication? They need to know if you have any of these conditions: Cancer Current or past resident of [place] or [place] Diabetes Exposure to tuberculosis Guillain-Barre syndrome Heart failure Liver disease Immune system problems Infection Lung or breathing disease, such as COPD Multiple sclerosis Receiving phototherapy for the skin Seizure disorder An unusual or allergic reaction to infliximab, mouse proteins, other medications, foods, dyes, or preservatives Pregnant or trying to get pregnant Breast-feeding How should I use this medication? This medication is injected into a vein or under the skin. If it is injected into a vein, it is given by your care team in a hospital or clinic setting. If it is injected under the skin, it may be given at home. If you get this medication at home, you will be taught how to prepare and give it. Use exactly as directed. Take it as directed on the prescription label at the same time every day. Keep taking it unless your care team tells you to stop. It is important that you put your used needles and syringes in a special sharps container. Do not put them in a trash can. If you do not have a sharps container, call your pharmacist or care team to get one. A special MedGuide will be given to you by the pharmacist with each prescription and refill. Be sure to read this information carefully each  time. Talk to your care team about the use of this medication in children. While it may be prescribed for children as young as 33 years of age for selected conditions, precautions do apply. Overdosage: If you think you have taken too much of this medicine contact a poison control center or emergency room at once. NOTE: This medicine is only for you. Do not share this medicine with others. What if I miss a dose? If you get this medication at the hospital or clinic: It is important not to miss your dose. Call your care team if you are unable to keep an appointment. If you give yourself this medication at home: If you miss a dose, take it as soon as you can. Then give the next dose 2 weeks later and continue your normal schedule. If it is almost time for your next dose, take only that dose. Do not take double or extra doses. Call your care team with questions. What may interact with this medication? Do not take this medication with any of the following: Biologic medications, such as abatacept, adalimumab, anakinra, certolizumab, etanercept, golimumab, rituximab, secukinumab, tocilizumab, tofactinib, ustekinumab Live vaccines This list may not describe all possible interactions. Give your health care provider a list of all the medicines, herbs, non-prescription drugs, or dietary supplements you use. Also tell them if you smoke, drink alcohol, or use illegal drugs. Some items may interact with your medicine. What should I watch for while using this medication? Visit your care team for regular checks on your progress. Your condition will be monitored carefully while you are receiving this medication. You may  need blood work done while you are taking this medication. You will be tested for tuberculosis (TB) before you start this medication. If your care team prescribes any medication for TB, you should start taking the TB medication before starting this medication. Make sure to finish the full course of TB  medication. This medication may increase your risk of getting an infection. Call your care team for advice if you get a fever, chills, sore throat, or other symptoms of a cold or flu. Do not treat yourself. Try to avoid being around people who are sick. This medication may make the symptoms of heart failure worse in some patients. Contact your care team right away if you develop signs or symptoms of heart failure. Before having surgery or dental work, talk to your care team to make sure it is ok. This medication can increase the risk of poor healing of your surgical site or wound. If you take this medication for plaque psoriasis, stay out of the sun. If you cannot avoid being in the sun, wear protective clothing and sunscreen. Do not use sun lamps or tanning beds/booths. Talk to your care team about your risk of cancer. You may be more at risk for certain types of cancers if you take this medication. What side effects may I notice from receiving this medication? Side effects that you should report to your care team as soon as possible: Allergic reactions--skin rash, itching, hives, swelling of the face, lips, tongue, or throat Body pain, tingling, or numbness Heart attack--pain or tightness in the chest, shoulders, arms, or jaw, nausea, shortness of breath, cold or clammy skin, feeling faint or lightheaded Heart failure--shortness of breath, swelling of the ankles, feet, or hands, sudden weight gain, unusual weakness or fatigue Heart rhythm changes--fast or irregular heartbeat, dizziness, feeling faint or lightheaded, chest pain, trouble breathing Increase in blood pressure Infection--fever, chills, cough, sore throat, wounds that don't heal, pain or trouble when passing urine, general feeling of discomfort or being unwell Liver injury--right upper belly pain, loss of appetite, nausea, light-colored stool, dark yellow or brown urine, yellowing skin or eyes, unusual weakness or fatigue Low blood  pressure--dizziness, feeling faint or lightheaded, blurry vision Lupus-like syndrome--joint pain, swelling, or stiffness, butterfly-shaped rash on the face, rashes that get worse in the sun, fever, unusual weakness or fatigue Seizures Stroke--sudden numbness or weakness of the face, arm, or leg, trouble speaking, confusion, trouble walking, loss of balance or coordination, dizziness, severe headache, change in vision Sudden vision loss in one or both eyes Unusual bruising or bleeding Side effects that usually do not require medical attention (report to your care team if they continue or are bothersome): Cough Diarrhea Fatigue Headache Nausea Runny or stuffy nose Sore throat Stomach pain This list may not describe all possible side effects. Call your doctor for medical advice about side effects. You may report side effects to FDA at 1-800-FDA-1088. Where should I keep my medication? Keep out of the reach of children and pets. See product for storage information. Each product may have different instructions. Get rid of any unused medication after the expiration date. To get rid of medications that are no longer needed or have expired: Take the medication to a medication take-back program. Check with your pharmacy or law enforcement to find a location. If you cannot return the medication, ask your pharmacist or care team how to get rid of this medication safely. NOTE: This sheet is a summary. It may not cover all possible information. If  you have questions about this medicine, talk to your doctor, pharmacist, or health care provider.  2024 Elsevier/Gold Standard (2022-01-05 00:00:00)

## 2022-09-28 NOTE — Progress Notes (Signed)
Diagnosis: Crohn's Disease  Provider:  Chilton Greathouse MD  Procedure: IV Infusion  IV Type: Peripheral, IV Location: L Forearm  Remicade (Infliximab), Dose: 300 mg  Infusion Start Time: 1025  Infusion Stop Time: 1242  Post Infusion IV Care: Observation period completed and Peripheral IV Discontinued  Discharge: Condition: Good, Destination: Home . AVS Provided  Performed by:  Nat Math, RN

## 2022-10-03 ENCOUNTER — Ambulatory Visit (INDEPENDENT_AMBULATORY_CARE_PROVIDER_SITE_OTHER): Payer: Medicaid Other

## 2022-10-03 VITALS — BP 155/89 | HR 98 | Temp 97.9°F | Resp 16 | Ht 65.0 in | Wt 141.4 lb

## 2022-10-03 DIAGNOSIS — D649 Anemia, unspecified: Secondary | ICD-10-CM

## 2022-10-03 DIAGNOSIS — D500 Iron deficiency anemia secondary to blood loss (chronic): Secondary | ICD-10-CM

## 2022-10-03 DIAGNOSIS — N920 Excessive and frequent menstruation with regular cycle: Secondary | ICD-10-CM

## 2022-10-03 DIAGNOSIS — K50919 Crohn's disease, unspecified, with unspecified complications: Secondary | ICD-10-CM

## 2022-10-03 MED ORDER — SODIUM CHLORIDE 0.9 % IV SOLN
200.0000 mg | Freq: Once | INTRAVENOUS | Status: AC
  Administered 2022-10-03: 200 mg via INTRAVENOUS
  Filled 2022-10-03: qty 10

## 2022-10-03 MED ORDER — ACETAMINOPHEN 325 MG PO TABS
650.0000 mg | ORAL_TABLET | Freq: Once | ORAL | Status: AC
  Administered 2022-10-03: 650 mg via ORAL
  Filled 2022-10-03: qty 2

## 2022-10-03 MED ORDER — DIPHENHYDRAMINE HCL 25 MG PO CAPS: 25.0000 mg | ORAL_CAPSULE | Freq: Once | ORAL | Status: DC

## 2022-10-03 NOTE — Progress Notes (Signed)
Diagnosis: Acute Anemia  Provider:  Chilton Greathouse MD  Procedure: IV Infusion  IV Type: Peripheral, IV Location: R Antecubital  Venofer (Iron Sucrose), Dose: 200 mg  Infusion Start Time: 1002  Infusion Stop Time: 1019  Post Infusion IV Care: Patient declined observation and Peripheral IV Discontinued  Discharge: Condition: Good, Destination: Home . AVS Provided  Performed by:  Wyvonne Lenz, RN

## 2022-10-06 ENCOUNTER — Other Ambulatory Visit: Payer: Self-pay | Admitting: Physician Assistant

## 2022-10-06 DIAGNOSIS — D508 Other iron deficiency anemias: Secondary | ICD-10-CM

## 2022-10-07 ENCOUNTER — Inpatient Hospital Stay: Payer: Medicaid Other

## 2022-10-07 ENCOUNTER — Inpatient Hospital Stay: Payer: Medicaid Other | Admitting: Physician Assistant

## 2022-10-07 ENCOUNTER — Telehealth: Payer: Self-pay | Admitting: Physician Assistant

## 2022-10-12 ENCOUNTER — Ambulatory Visit (INDEPENDENT_AMBULATORY_CARE_PROVIDER_SITE_OTHER): Payer: Medicaid Other

## 2022-10-12 VITALS — BP 123/79 | HR 91 | Temp 98.2°F | Resp 16 | Ht 65.0 in | Wt 142.6 lb

## 2022-10-12 DIAGNOSIS — K50919 Crohn's disease, unspecified, with unspecified complications: Secondary | ICD-10-CM | POA: Diagnosis not present

## 2022-10-12 MED ORDER — SODIUM CHLORIDE 0.9 % IV SOLN
5.0000 mg/kg | Freq: Once | INTRAVENOUS | Status: AC
  Administered 2022-10-12: 300 mg via INTRAVENOUS
  Filled 2022-10-12: qty 30

## 2022-10-12 MED ORDER — DIPHENHYDRAMINE HCL 25 MG PO CAPS
25.0000 mg | ORAL_CAPSULE | Freq: Once | ORAL | Status: AC
  Administered 2022-10-12: 25 mg via ORAL
  Filled 2022-10-12: qty 1

## 2022-10-12 MED ORDER — METHYLPREDNISOLONE SODIUM SUCC 40 MG IJ SOLR
40.0000 mg | Freq: Once | INTRAMUSCULAR | Status: AC
  Administered 2022-10-12: 40 mg via INTRAVENOUS
  Filled 2022-10-12: qty 1

## 2022-10-12 MED ORDER — ACETAMINOPHEN 325 MG PO TABS
650.0000 mg | ORAL_TABLET | Freq: Once | ORAL | Status: AC
  Administered 2022-10-12: 325 mg via ORAL
  Filled 2022-10-12: qty 2

## 2022-10-12 NOTE — Progress Notes (Signed)
Diagnosis: Crohn's Disease  Provider:  Chilton Greathouse MD  Procedure: IV Infusion  IV Type: Peripheral, IV Location: R Antecubital  Remicade (Infliximab), Dose: 300 mg  Infusion Start Time: 1056  Infusion Stop Time: 1309  Post Infusion IV Care: Patient declined observation and Peripheral IV Discontinued  Discharge: Condition: Stable, Destination: Home . AVS Provided  Performed by:  Wyvonne Lenz, RN

## 2022-10-12 NOTE — Telephone Encounter (Signed)
DD, ok to refill Promethazine (Phenergan) 12.5mg  one tab po Q 6 hrs PRN nausea/vomiting # 30, no refills. THX.

## 2022-10-12 NOTE — Telephone Encounter (Signed)
Colleen, please advise.

## 2022-10-13 MED ORDER — PROMETHAZINE HCL 12.5 MG PO TABS: 12.5000 mg | ORAL_TABLET | Freq: Four times a day (QID) | ORAL | 0 refills | Status: DC | PRN

## 2022-10-14 ENCOUNTER — Ambulatory Visit: Payer: Medicaid Other

## 2022-10-18 ENCOUNTER — Ambulatory Visit: Payer: BLUE CROSS/BLUE SHIELD | Admitting: Gastroenterology

## 2022-10-19 ENCOUNTER — Telehealth: Payer: Self-pay | Admitting: Pharmacy Technician

## 2022-10-19 NOTE — Telephone Encounter (Addendum)
Re-newal Auth  Auth Submission: APPROVED - NOTE DATE  Site of care: Site of care: [facility] Payer: [employer] MEDICAID Medication & CPT/J Code(s) submitted: Remicade (Infliximab) J1745 Route of submission (phone, fax, portal):  Phone # [phone] Fax # [phone] Auth type: Buy/Bill PB Units/visits requested:  Reference number: [id number] Approval from: 10/19/22 - 10/24/22  Patient insurance will term on 10/24/22.  Attempted to reach for insurance f/u.  Awaiting a call back.

## 2022-10-21 ENCOUNTER — Ambulatory Visit: Payer: Medicaid Other

## 2022-10-21 MED ORDER — ACETAMINOPHEN 325 MG PO TABS: 650.0000 mg | ORAL_TABLET | Freq: Once | ORAL | Status: DC

## 2022-10-21 MED ORDER — SODIUM CHLORIDE 0.9 % IV SOLN
200.0000 mg | Freq: Once | INTRAVENOUS | Status: DC
  Filled 2022-10-21: qty 10

## 2022-10-21 MED ORDER — DIPHENHYDRAMINE HCL 25 MG PO CAPS: 25.0000 mg | ORAL_CAPSULE | Freq: Once | ORAL | Status: DC

## 2022-10-24 ENCOUNTER — Encounter: Payer: Self-pay | Admitting: Physician Assistant

## 2022-10-24 ENCOUNTER — Encounter: Payer: Self-pay | Admitting: Gastroenterology

## 2022-10-25 ENCOUNTER — Encounter: Payer: Self-pay | Admitting: Emergency Medicine

## 2022-10-25 ENCOUNTER — Other Ambulatory Visit: Payer: Self-pay

## 2022-10-25 ENCOUNTER — Ambulatory Visit
Admission: EM | Admit: 2022-10-25 | Discharge: 2022-10-25 | Disposition: A | Discharge status: Home / Self Care | Payer: Medicaid Other | Attending: Internal Medicine | Admitting: Internal Medicine

## 2022-10-25 ENCOUNTER — Ambulatory Visit (HOSPITAL_COMMUNITY): Payer: Medicaid Other

## 2022-10-25 DIAGNOSIS — Z1152 Encounter for screening for COVID-19: Secondary | ICD-10-CM | POA: Diagnosis not present

## 2022-10-25 DIAGNOSIS — B349 Viral infection, unspecified: Secondary | ICD-10-CM | POA: Diagnosis not present

## 2022-10-25 DIAGNOSIS — K5090 Crohn's disease, unspecified, without complications: Secondary | ICD-10-CM | POA: Insufficient documentation

## 2022-10-25 DIAGNOSIS — R509 Fever, unspecified: Secondary | ICD-10-CM | POA: Diagnosis present

## 2022-10-25 LAB — POCT INFLUENZA A/B
Influenza A, POC: NEGATIVE
Influenza B, POC: NEGATIVE

## 2022-10-25 MED ORDER — KETOROLAC TROMETHAMINE 30 MG/ML IJ SOLN
30.0000 mg | Freq: Once | INTRAMUSCULAR | Status: AC
  Administered 2022-10-25: 30 mg via INTRAMUSCULAR

## 2022-10-25 MED ORDER — ACETAMINOPHEN 325 MG PO TABS
975.0000 mg | ORAL_TABLET | Freq: Once | ORAL | Status: AC
  Administered 2022-10-25: 975 mg via ORAL

## 2022-10-25 NOTE — ED Triage Notes (Signed)
Pt here for fever and body aches x 3 days; pt sts chills and nausea

## 2022-10-25 NOTE — Discharge Instructions (Signed)
Rapid flu is negative.  COVID test pending.  Blood work pending.  We will call if it is abnormal.  Suspect that you have a viral illness that should run its course.  Continue to monitor your fever at home closely.  Do not take any ibuprofen, Advil, Aleve for at least 24 hours following injection today.  You may take Tylenol if necessary.  Ensure adequate fluid hydration and rest.  Go to the emergency department if symptoms persist or worsen.

## 2022-10-25 NOTE — ED Provider Notes (Signed)
[facility]    CSN: [id number] Arrival date & time: 10/25/22  0934      History   Chief Complaint Chief Complaint  Patient presents with   Fever    HPI Dawn Walsh is a 47 y.o. female.   Patient presents with bodyaches, mild runny nose, chills, nausea without vomiting that started 2 to 3 days ago.  Denies nasal congestion, cough, sore throat, fever at home.  Reports her family members have had similar symptoms.  She has not taken any medications for symptoms.  Patient does have history of Crohn's disease and anemia where she received a Remicade injection on 10/21/2022. Denies any urinary symptoms or diarrhea.    Fever   Past Medical History:  Diagnosis Date   Anemia 06/04/2021   Hypertension     Patient Active Problem List   Diagnosis Date Noted   Crohn's disease with complication (HCC) 08/05/2022   Ileitis 05/24/2022   Symptomatic anemia 05/23/2022   Abdominal pain 05/23/2022   Nausea and vomiting 05/23/2022   Unintentional weight loss 05/23/2022   Anemia 06/04/2021   LLQ pain 06/04/2021    Past Surgical History:  Procedure Laterality Date   BIOPSY  05/24/2022   Procedure: BIOPSY;  Surgeon: Shellia Cleverly, DO;  Location: [facility];  Service: Gastroenterology;;   COLONOSCOPY N/A 05/24/2022   Procedure: COLONOSCOPY;  Surgeon: Shellia Cleverly, DO;  Location: [facility];  Service: Gastroenterology;  Laterality: N/A;   ESOPHAGOGASTRODUODENOSCOPY N/A 05/24/2022   Procedure: ESOPHAGOGASTRODUODENOSCOPY (EGD);  Surgeon: Shellia Cleverly, DO;  Location: [facility] ENDOSCOPY;  Service: Gastroenterology;  Laterality: N/A;   TUBAL LIGATION  04/09/2000    OB History   No obstetric history on file.      Home Medications    Prior to Admission medications   Medication Sig Start Date End Date Taking? Authorizing Provider  inFLIXimab 300 mg in sodium chloride 0.9 % 220 mL Inject 300 mg into the vein every 8 (eight) weeks. 09/14/22   Napoleon Form, MD  cyanocobalamin (VITAMIN B12) 250 MCG tablet Take 1 tablet (250 mcg total) by mouth daily. Patient taking differently: Take 250 mcg by mouth daily. Gummies 05/25/22   Elgergawy, Leana Roe, MD  ondansetron (ZOFRAN) 4 MG tablet Take 1 tablet (4 mg total) by mouth every 8 (eight) hours as needed for nausea or vomiting. 08/01/22   Arnaldo Natal, NP  pantoprazole (PROTONIX) 40 MG tablet Take 1 tablet (40 mg total) by mouth daily. 05/25/22 07/31/22  Elgergawy, Leana Roe, MD  pantoprazole (PROTONIX) 40 MG tablet Take 1 tablet (40 mg total) by mouth daily. 08/01/22   Arnaldo Natal, NP  predniSONE (DELTASONE) 10 MG tablet Take 2 tablets (20 mg total) by mouth daily. Once you receive the induction dose of Avsola, decrease prednisone by 5 mg every week. 08/18/22   Napoleon Form, MD  promethazine (PHENERGAN) 12.5 MG tablet Take 1 tablet (12.5 mg total) by mouth every 6 (six) hours as needed for nausea or vomiting. 10/13/22   Arnaldo Natal, NP    Family History Family History  Problem Relation Age of Onset   Diabetes Mother    Breast cancer Mother    Colon cancer Neg Hx    Stomach cancer Neg Hx     Social History Social History   Tobacco Use   Smoking status: Some Days    Types: Cigars  Vaping Use   Vaping status: Never Used  Substance Use Topics   Alcohol  use: No   Drug use: Yes    Frequency: 6.0 times per week    Types: Marijuana    Comment: every other day     Allergies   Morphine and codeine   Review of Systems Review of Systems Per HPI  Physical Exam Triage Vital Signs ED Triage Vitals [10/25/22 1036]  Encounter Vitals Group     BP (!) 148/85     Systolic BP Percentile      Diastolic BP Percentile      Pulse Rate (!) 123     Resp 18     Temp (!) 103.1 F (39.5 C)     Temp Source Oral     SpO2 98 %     Weight      Height      Head Circumference      Peak Flow      Pain Score 10     Pain Loc      Pain Education      Exclude from  Growth Chart    No data found.  Updated Vital Signs BP (!) 148/85 (BP Location: Left Arm)   Pulse (!) 123   Temp (!) 102.4 F (39.1 C) (Oral)   Resp 18   SpO2 98%   Visual Acuity Right Eye Distance:   Left Eye Distance:   Bilateral Distance:    Right Eye Near:   Left Eye Near:    Bilateral Near:     Physical Exam Constitutional:      General: She is not in acute distress.    Appearance: Normal appearance. She is not toxic-appearing or diaphoretic.  HENT:     Head: Normocephalic and atraumatic.     Right Ear: Tympanic membrane and ear canal normal.     Left Ear: Tympanic membrane and ear canal normal.     Nose: Congestion present.     Mouth/Throat:     Mouth: Mucous membranes are moist.     Pharynx: No posterior oropharyngeal erythema.  Eyes:     Extraocular Movements: Extraocular movements intact.     Conjunctiva/sclera: Conjunctivae normal.     Pupils: Pupils are equal, round, and reactive to light.  Cardiovascular:     Rate and Rhythm: Normal rate and regular rhythm.     Pulses: Normal pulses.     Heart sounds: Normal heart sounds.  Pulmonary:     Effort: Pulmonary effort is normal. No respiratory distress.     Breath sounds: Normal breath sounds. No stridor. No wheezing, rhonchi or rales.  Abdominal:     General: Abdomen is flat. Bowel sounds are normal.     Palpations: Abdomen is soft.  Musculoskeletal:        General: Normal range of motion.     Cervical back: Normal range of motion.  Skin:    General: Skin is warm and dry.  Neurological:     General: No focal deficit present.     Mental Status: She is alert and oriented to person, place, and time. Mental status is at baseline.  Psychiatric:        Mood and Affect: Mood normal.        Behavior: Behavior normal.      UC Treatments / Results  Labs (all labs ordered are listed, but only abnormal results are displayed) Labs Reviewed  POCT INFLUENZA A/B - Normal  SARS CORONAVIRUS 2 (TAT 6-24 HRS)   CBC    EKG   Radiology No results found.  Procedures Procedures (including critical care time)  Medications Ordered in UC Medications  acetaminophen (TYLENOL) tablet 975 mg (975 mg Oral Given 10/25/22 1039)  ketorolac (TORADOL) 30 MG/ML injection 30 mg (30 mg Intramuscular Given 10/25/22 1119)    Initial Impression / Assessment and Plan / UC Course  I have reviewed the triage vital signs and the nursing notes.  Pertinent labs & imaging results that were available during my care of the patient were reviewed by me and considered in my medical decision making (see chart for details).     Suspect viral cause to symptoms.  Rapid flu is negative.  COVID test is pending.  Tylenol administered in urgent care for fever with minimal improvement so IM Toradol administered with mild improvement in fever.  Advised no NSAIDs for at least 24 hours following injection today.  Encouraged patient to monitor fever closely at home and discussed fever monitoring management, adequate fluids, rest.  There is a mild concern given patient just recently received Remicade injection for Crohn's that fever could be related so will obtain CBC to rule this out.  UA deferred given no urinary symptoms and patient has known sick contacts. Encouraged strict ER precautions and follow-up precautions.  Patient verbalized understanding and was agreeable with plan. Final Clinical Impressions(s) / UC Diagnoses   Final diagnoses:  Viral illness  Fever, unspecified     Discharge Instructions      Rapid flu is negative.  COVID test pending.  Blood work pending.  We will call if it is abnormal.  Suspect that you have a viral illness that should run its course.  Continue to monitor your fever at home closely.  Do not take any ibuprofen, Advil, Aleve for at least 24 hours following injection today.  You may take Tylenol if necessary.  Ensure adequate fluid hydration and rest.  Go to the emergency department if symptoms  persist or worsen.     ED Prescriptions   None    PDMP not reviewed this encounter.   Gustavus Bryant, [place] 10/25/22 [phone]

## 2022-10-26 LAB — CBC
Hematocrit: 34.3 % (ref 34.0–46.6)
Hemoglobin: 10.8 g/dL — ABNORMAL LOW (ref 11.1–15.9)
MCH: 28.4 pg (ref 26.6–33.0)
MCHC: 31.5 g/dL (ref 31.5–35.7)
MCV: 90 fL (ref 79–97)
Platelets: 277 10*3/uL (ref 150–450)
RBC: 3.8 x10E6/uL (ref 3.77–5.28)
RDW: 18.9 % — ABNORMAL HIGH (ref 11.7–15.4)
WBC: 11.8 10*3/uL — ABNORMAL HIGH (ref 3.4–10.8)

## 2022-10-26 LAB — SARS CORONAVIRUS 2 (TAT 6-24 HRS): SARS Coronavirus 2: NEGATIVE

## 2022-10-27 ENCOUNTER — Other Ambulatory Visit: Payer: Self-pay | Admitting: Physician Assistant

## 2022-10-27 ENCOUNTER — Encounter (HOSPITAL_COMMUNITY): Payer: Self-pay

## 2022-10-27 ENCOUNTER — Emergency Department (HOSPITAL_COMMUNITY)
Admission: EM | Admit: 2022-10-27 | Discharge: 2022-10-27 | Disposition: A | Discharge status: Home / Self Care | Payer: Medicaid Other | Attending: Emergency Medicine | Admitting: Emergency Medicine

## 2022-10-27 DIAGNOSIS — B349 Viral infection, unspecified: Secondary | ICD-10-CM | POA: Insufficient documentation

## 2022-10-27 DIAGNOSIS — R509 Fever, unspecified: Secondary | ICD-10-CM | POA: Diagnosis present

## 2022-10-27 DIAGNOSIS — D508 Other iron deficiency anemias: Secondary | ICD-10-CM

## 2022-10-27 MED ORDER — KETOROLAC TROMETHAMINE 15 MG/ML IJ SOLN
15.0000 mg | Freq: Once | INTRAMUSCULAR | Status: AC
  Administered 2022-10-27: 15 mg via INTRAMUSCULAR
  Filled 2022-10-27: qty 1

## 2022-10-27 NOTE — ED Provider Notes (Signed)
[place] EMERGENCY DEPARTMENT AT [facility] Provider Note   CSN: [id number] Arrival date & time: 10/27/22  0440     History  Chief Complaint  Patient presents with   Fever   Torticollis    Dawn Walsh is a 47 y.o. female.  47 yo F with a cc of cough, fever.  Going on for about 3 days now.  Seen at urgent care yesterday.  Woke up and checked her temperature and was elevated.  She has only been taking tylenol, she cannot take ibuprofen with her Crohn's disease.   Fever      Home Medications Prior to Admission medications   Medication Sig Start Date End Date Taking? Authorizing Provider  inFLIXimab 300 mg in sodium chloride 0.9 % 220 mL Inject 300 mg into the vein every 8 (eight) weeks. 09/14/22   Napoleon Form, MD  cyanocobalamin (VITAMIN B12) 250 MCG tablet Take 1 tablet (250 mcg total) by mouth daily. Patient taking differently: Take 250 mcg by mouth daily. Gummies 05/25/22   Elgergawy, Leana Roe, MD  ondansetron (ZOFRAN) 4 MG tablet Take 1 tablet (4 mg total) by mouth every 8 (eight) hours as needed for nausea or vomiting. 08/01/22   Arnaldo Natal, NP  pantoprazole (PROTONIX) 40 MG tablet Take 1 tablet (40 mg total) by mouth daily. 05/25/22 07/31/22  Elgergawy, Leana Roe, MD  pantoprazole (PROTONIX) 40 MG tablet Take 1 tablet (40 mg total) by mouth daily. 08/01/22   Arnaldo Natal, NP  predniSONE (DELTASONE) 10 MG tablet Take 2 tablets (20 mg total) by mouth daily. Once you receive the induction dose of Avsola, decrease prednisone by 5 mg every week. 08/18/22   Napoleon Form, MD  promethazine (PHENERGAN) 12.5 MG tablet Take 1 tablet (12.5 mg total) by mouth every 6 (six) hours as needed for nausea or vomiting. 10/13/22   Arnaldo Natal, NP      Allergies    Morphine and codeine    Review of Systems   Review of Systems  Constitutional:  Positive for fever.    Physical Exam Updated Vital Signs BP (!) 141/100 (BP  Location: Right Arm)   Pulse (!) 126   Temp (!) 101.3 F (38.5 C) (Oral)   Resp 18   SpO2 100%  Physical Exam Vitals and nursing note reviewed.  Constitutional:      General: She is not in acute distress.    Appearance: She is well-developed. She is not diaphoretic.  HENT:     Head: Normocephalic and atraumatic.     Comments: Swollen turbinates, posterior nasal drip, no noted sinus ttp, tm normal bilaterally.   Eyes:     Pupils: Pupils are equal, round, and reactive to light.  Cardiovascular:     Rate and Rhythm: Normal rate and regular rhythm.     Heart sounds: No murmur heard.    No friction rub. No gallop.  Pulmonary:     Effort: Pulmonary effort is normal.     Breath sounds: No wheezing or rales.  Abdominal:     General: There is no distension.     Palpations: Abdomen is soft.     Tenderness: There is no abdominal tenderness.  Musculoskeletal:        General: No tenderness.     Cervical back: Normal range of motion and neck supple.  Skin:    General: Skin is warm and dry.  Neurological:     Mental Status: She is alert and  oriented to person, place, and time.  Psychiatric:        Behavior: Behavior normal.     ED Results / Procedures / Treatments   Labs (all labs ordered are listed, but only abnormal results are displayed) Labs Reviewed - No data to display  EKG None  Radiology No results found.  Procedures Procedures    Medications Ordered in ED Medications  ketorolac (TORADOL) 15 MG/ML injection 15 mg (has no administration in time range)    ED Course/ Medical Decision Making/ A&P                                 Medical Decision Making Risk Prescription drug management.   47 yo F with a chief complaints of cough and fever.  This has been going on for about 3 days.  She is well-appearing and nontoxic.  Appears well-hydrated.  She is mildly tachycardic on arrival.  Clear lung sounds for me.  No bacterial source was found on exam.  I suspect she  is likely febrile as she is underdosing Tylenol and is unable to take ibuprofen or naproxen at home.  Will give a dose of Toradol here.  Discussed symptomatic therapy with her.  Will have her follow-up with her PCP.  Patient also told nursing that she was having neck pain.  This is more consistent with myalgias.  She has no meningeal signs.  4:59 AM:  I have discussed the diagnosis/risks/treatment options with the patient.  Evaluation and diagnostic testing in the emergency department does not suggest an emergent condition requiring admission or immediate intervention beyond what has been performed at this time.  They will follow up with PCP. We also discussed returning to the ED immediately if new or worsening sx occur. We discussed the sx which are most concerning (e.g., sudden worsening pain, fever, inability to tolerate by mouth) that necessitate immediate return. Medications administered to the patient during their visit and any new prescriptions provided to the patient are listed below.  Medications given during this visit Medications  ketorolac (TORADOL) 15 MG/ML injection 15 mg (has no administration in time range)     The patient appears reasonably screen and/or stabilized for discharge and I doubt any other medical condition or other [facility] requiring further screening, evaluation, or treatment in the ED at this time prior to discharge.          Final Clinical Impression(s) / ED Diagnoses Final diagnoses:  Viral illness    Rx / DC Orders ED Discharge Orders     None         Melene Plan, DO 10/27/22 [phone]

## 2022-10-27 NOTE — Discharge Instructions (Signed)
Take tylenol 2 pills 4 times a day and motrin 4 pills 3 times a day.  Drink plenty of fluids.  Return for worsening shortness of breath, headache, confusion. Follow up with your family doctor.   

## 2022-10-27 NOTE — ED Triage Notes (Signed)
Pt arrived from home via [facility] c/o fever since Sunday highest at home 104.2. Pt c/o painful/stiff neck 10/10 pain scale.

## 2022-10-28 ENCOUNTER — Inpatient Hospital Stay: Payer: Medicaid Other

## 2022-10-28 ENCOUNTER — Ambulatory Visit (INDEPENDENT_AMBULATORY_CARE_PROVIDER_SITE_OTHER): Payer: Medicaid Other

## 2022-10-28 ENCOUNTER — Telehealth: Payer: Self-pay | Admitting: Pharmacy Technician

## 2022-10-28 ENCOUNTER — Ambulatory Visit (HOSPITAL_COMMUNITY)
Admission: RE | Admit: 2022-10-28 | Discharge: 2022-10-28 | Disposition: A | Discharge status: Home / Self Care | Payer: Medicaid Other | Source: Ambulatory Visit | Attending: Emergency Medicine | Admitting: Emergency Medicine

## 2022-10-28 ENCOUNTER — Inpatient Hospital Stay: Payer: Medicaid Other | Admitting: Physician Assistant

## 2022-10-28 ENCOUNTER — Encounter (HOSPITAL_COMMUNITY): Payer: Self-pay

## 2022-10-28 VITALS — BP 135/87 | HR 125 | Temp 100.2°F | Resp 18

## 2022-10-28 DIAGNOSIS — J189 Pneumonia, unspecified organism: Secondary | ICD-10-CM

## 2022-10-28 DIAGNOSIS — R509 Fever, unspecified: Secondary | ICD-10-CM | POA: Diagnosis present

## 2022-10-28 LAB — POCT URINALYSIS DIP (MANUAL ENTRY)
Glucose, UA: NEGATIVE mg/dL
Nitrite, UA: NEGATIVE
Protein Ur, POC: >=300 mg/dL — AB
Spec Grav, UA: >=1.03 — AB (ref 1.010–1.025)
Urobilinogen, UA: 1 U/dL
pH, UA: 6 (ref 5.0–8.0)

## 2022-10-28 LAB — CBC WITH DIFFERENTIAL/PLATELET
Abs Immature Granulocytes: 0.06 10*3/uL (ref 0.00–0.07)
Basophils Absolute: 0 10*3/uL (ref 0.0–0.1)
Basophils Relative: 0 %
Eosinophils Absolute: 0.2 10*3/uL (ref 0.0–0.5)
Eosinophils Relative: 2 %
HCT: 31.9 % — ABNORMAL LOW (ref 36.0–46.0)
Hemoglobin: 10.2 g/dL — ABNORMAL LOW (ref 12.0–15.0)
Immature Granulocytes: 1 %
Lymphocytes Relative: 10 %
Lymphs Abs: 1.1 10*3/uL (ref 0.7–4.0)
MCH: 28.7 pg (ref 26.0–34.0)
MCHC: 32 g/dL (ref 30.0–36.0)
MCV: 89.6 fL (ref 80.0–100.0)
Monocytes Absolute: 0.9 10*3/uL (ref 0.1–1.0)
Monocytes Relative: 8 %
Neutro Abs: 8.1 10*3/uL — ABNORMAL HIGH (ref 1.7–7.7)
Neutrophils Relative %: 79 %
Platelets: 317 10*3/uL (ref 150–400)
RBC: 3.56 MIL/uL — ABNORMAL LOW (ref 3.87–5.11)
RDW: 20.5 % — ABNORMAL HIGH (ref 11.5–15.5)
WBC: 10.3 10*3/uL (ref 4.0–10.5)
nRBC: 0 % (ref 0.0–0.2)

## 2022-10-28 LAB — COMPREHENSIVE METABOLIC PANEL
ALT: 25 U/L (ref 0–44)
AST: 23 U/L (ref 15–41)
Albumin: 2.6 g/dL — ABNORMAL LOW (ref 3.5–5.0)
Alkaline Phosphatase: 59 U/L (ref 38–126)
Anion gap: 11 (ref 5–15)
BUN: 11 mg/dL (ref 6–20)
CO2: 22 mmol/L (ref 22–32)
Calcium: 8.8 mg/dL — ABNORMAL LOW (ref 8.9–10.3)
Chloride: 100 mmol/L (ref 98–111)
Creatinine, Ser: 0.93 mg/dL (ref 0.44–1.00)
GFR, Estimated: >60 mL/min (ref >60)
Glucose, Bld: 80 mg/dL (ref 70–99)
Potassium: 3.8 mmol/L (ref 3.5–5.1)
Sodium: 133 mmol/L — ABNORMAL LOW (ref 135–145)
Total Bilirubin: 1 mg/dL (ref 0.3–1.2)
Total Protein: 6.7 g/dL (ref 6.5–8.1)

## 2022-10-28 MED ORDER — AMOXICILLIN-POT CLAVULANATE 875-125 MG PO TABS: 1.0000 | ORAL_TABLET | Freq: Two times a day (BID) | ORAL | 0 refills | Status: DC

## 2022-10-28 MED ORDER — SODIUM CHLORIDE 0.9 % IV SOLN
200.0000 mg | Freq: Once | INTRAVENOUS | Status: DC
  Filled 2022-10-28: qty 10

## 2022-10-28 MED ORDER — DIPHENHYDRAMINE HCL 25 MG PO CAPS: 25.0000 mg | ORAL_CAPSULE | Freq: Once | ORAL | Status: DC

## 2022-10-28 MED ORDER — ACETAMINOPHEN 325 MG PO TABS
ORAL_TABLET | ORAL | Status: AC
  Filled 2022-10-28: qty 3

## 2022-10-28 MED ORDER — AZITHROMYCIN 250 MG PO TABS: 250.0000 mg | ORAL_TABLET | Freq: Every day | ORAL | 0 refills | Status: DC

## 2022-10-28 MED ORDER — ACETAMINOPHEN 325 MG PO TABS
975.0000 mg | ORAL_TABLET | Freq: Once | ORAL | Status: AC
  Administered 2022-10-28: 975 mg via ORAL

## 2022-10-28 MED ORDER — ACETAMINOPHEN 325 MG PO TABS: 650.0000 mg | ORAL_TABLET | Freq: Once | ORAL | Status: DC

## 2022-10-28 NOTE — ED Provider Notes (Signed)
[facility]    CSN: [id number] Arrival date & time: 10/28/22  0272      History   Chief Complaint Chief Complaint  Patient presents with   Fever    I went to urgent care on [address] two days ago and to [place] [facility] and either one could tell me what is wrong and it have to be sometime I keep having fevers 103 and above - Entered by patient    HPI Dawn Walsh is a 47 y.o. female.   Patient presents to clinic for continued fever for the past 5 days.  She has also been having leg pain, headache, nasal congestion and feeling unwell. Overall appetite has been diminished. She did have a loose bowel movement this morning, the first one in a few days. No abdominal pain. Reports increased urination, no dysuria.   Overall diminished appetite, felt weak this morning.   She is tearful in clinic and frustrated.  She had negative COVID-19 and flu testing on October 1.  She has not take Motrin due to her Crohn's, she did take 2 last night due to her continued fever.  Has been taking Tylenol around-the-clock.  She has been to have an infusion today for her Crohn's but had to cancel due to her fever.  Denies any cough or wheezing. Has had some shortness of breath.  No abdominal pain.  No dysuria.  No changes in vaginal discharge or odor.   The history is provided by the patient and medical records.  Fever Associated symptoms: congestion, diarrhea and sore throat   Associated symptoms: no chest pain, no cough, no dysuria, no nausea and no vomiting     Past Medical History:  Diagnosis Date   Anemia 06/04/2021   Hypertension     Patient Active Problem List   Diagnosis Date Noted   Crohn's disease with complication (HCC) 08/05/2022   Ileitis 05/24/2022   Symptomatic anemia 05/23/2022   Abdominal pain 05/23/2022   Nausea and vomiting 05/23/2022   Unintentional weight loss 05/23/2022   Anemia 06/04/2021   LLQ pain 06/04/2021    Past Surgical History:  Procedure  Laterality Date   BIOPSY  05/24/2022   Procedure: BIOPSY;  Surgeon: Shellia Cleverly, DO;  [facility]: [facility];  Service: Gastroenterology;;   COLONOSCOPY N/A 05/24/2022   Procedure: COLONOSCOPY;  Surgeon: Shellia Cleverly, DO;  Location: [facility];  Service: Gastroenterology;  Laterality: N/A;   ESOPHAGOGASTRODUODENOSCOPY N/A 05/24/2022   Procedure: ESOPHAGOGASTRODUODENOSCOPY (EGD);  Surgeon: Shellia Cleverly, DO;  [facility] END[facility]COPY;  Service: Gastroenterology;  Laterality: N/A;   TUBAL LIGATION  04/09/2000    OB History   No obstetric history on file.      Home Medications    Prior to Admission medications   Medication Sig Start Date End Date Taking? Authorizing Provider  amoxicillin-clavulanate (AUGMENTIN) 875-125 MG tablet Take 1 tablet by mouth every 12 (twelve) hours. 10/28/22  Yes Rinaldo Ratel, [place] N, FNP  azithromycin (ZITHROMAX) 250 MG tablet Take 1 tablet (250 mg total) by mouth daily. Take first 2 tablets together, then 1 every day until finished. 10/28/22  Yes Rinaldo Ratel, [place] N, FNP  inFLIXimab 300 mg in sodium chloride 0.9 % 220 mL Inject 300 mg into the vein every 8 (eight) weeks. 09/14/22   Napoleon Form, MD  cyanocobalamin (VITAMIN B12) 250 MCG tablet Take 1 tablet (250 mcg total) by mouth daily. Patient taking differently: Take 250 mcg by mouth daily. Gummies 05/25/22   Elgergawy, Mliss Fritz  S, MD  ondansetron (ZOFRAN) 4 MG tablet Take 1 tablet (4 mg total) by mouth every 8 (eight) hours as needed for nausea or vomiting. 08/01/22   Arnaldo Natal, NP  pantoprazole (PROTONIX) 40 MG tablet Take 1 tablet (40 mg total) by mouth daily. 05/25/22 07/31/22  Elgergawy, Leana Roe, MD  pantoprazole (PROTONIX) 40 MG tablet Take 1 tablet (40 mg total) by mouth daily. 08/01/22   Arnaldo Natal, NP  promethazine (PHENERGAN) 12.5 MG tablet Take 1 tablet (12.5 mg total) by mouth every 6 (six) hours as needed for nausea or vomiting. 10/13/22   Arnaldo Natal, NP    Family History Family History  Problem Relation Age of Onset   Diabetes Mother    Breast cancer Mother    Colon cancer Neg Hx    Stomach cancer Neg Hx     Social History Social History   Tobacco Use   Smoking status: Some Days    Types: Cigars  Vaping Use   Vaping status: Never Used  Substance Use Topics   Alcohol use: No   Drug use: Yes    Frequency: 6.0 times per week    Types: Marijuana    Comment: every other day     Allergies   Morphine and codeine   Review of Systems Review of Systems  Constitutional:  Positive for appetite change and fever.  HENT:  Positive for congestion and sore throat.   Respiratory:  Negative for cough, shortness of breath and wheezing.   Cardiovascular:  Negative for chest pain.  Gastrointestinal:  Positive for diarrhea. Negative for abdominal pain, nausea and vomiting.  Genitourinary:  Positive for frequency. Negative for dysuria and vaginal bleeding.  Musculoskeletal:  Negative for back pain and gait problem.  Neurological:  Positive for weakness.     Physical Exam Triage Vital Signs ED Triage Vitals  Encounter Vitals Group     BP 10/28/22 0902 135/87     Systolic BP Percentile --      Diastolic BP Percentile --      Pulse Rate 10/28/22 0902 (!) 135     Resp 10/28/22 0902 18     Temp 10/28/22 0902 (!) 103 F (39.4 C)     Temp Source 10/28/22 0902 Oral     SpO2 10/28/22 0902 94 %     Weight --      Height --      Head Circumference --      Peak Flow --      Pain Score 10/28/22 0903 10     Pain Loc --      Pain Education --      Exclude from Growth Chart --    No data found.  Updated Vital Signs BP 135/87 (BP Location: Left Arm)   Pulse (!) 135   Temp (!) 103 F (39.4 C) (Oral)   Resp 18   LMP 10/24/2022   SpO2 94%   Visual Acuity Right Eye Distance:   Left Eye Distance:   Bilateral Distance:    Right Eye Near:   Left Eye Near:    Bilateral Near:     Physical Exam Vitals and nursing note  reviewed.  Constitutional:      Appearance: Normal appearance.  HENT:     Head: Normocephalic and atraumatic.     Right Ear: External ear normal.     Left Ear: External ear normal.     Nose: Nose normal.     Mouth/Throat:  Mouth: Mucous membranes are moist.  Cardiovascular:     Rate and Rhythm: Regular rhythm. Tachycardia present.     Heart sounds: Normal heart sounds. No murmur heard. Pulmonary:     Effort: Pulmonary effort is normal. No respiratory distress.     Breath sounds: Normal breath sounds.  Musculoskeletal:        General: Normal range of motion.  Skin:    General: Skin is warm.  Neurological:     General: No focal deficit present.     Mental Status: She is alert.  Psychiatric:        Mood and Affect: Mood normal.      UC Treatments / Results  Labs (all labs ordered are listed, but only abnormal results are displayed) Labs Reviewed  POCT URINALYSIS DIP (MANUAL ENTRY) - Abnormal; Notable for the following components:      Result Value   Color, UA straw (*)    Clarity, UA cloudy (*)    Bilirubin, UA small (*)    Ketones, POC UA small (15) (*)    Spec Grav, UA >=1.030 (*)    Blood, UA moderate (*)    Protein Ur, POC >=300 (*)    Leukocytes, UA Trace (*)    All other components within normal limits  URINE CULTURE  CBC WITH DIFFERENTIAL/PLATELET  COMPREHENSIVE METABOLIC PANEL    EKG   Radiology DG Chest 2 View  Result Date: 10/28/2022 CLINICAL DATA:  Shortness of breath and fever for 5 days. EXAM: CHEST - 2 VIEW COMPARISON:  03/01/2010 FINDINGS: Heart size is normal. Airspace opacity seen in the anterior right upper lobe, suspicious for pneumonia. Left lung is clear. No evidence of pleural effusion. IMPRESSION: Right upper lobe airspace disease, suspicious for pneumonia. Recommend continued chest radiographic follow-up to confirm resolution. Electronically Signed   By: Danae Orleans M.D.   On: 10/28/2022 10:17    Procedures Procedures (including  critical care time)  Medications Ordered in UC Medications  acetaminophen (TYLENOL) tablet 975 mg (975 mg Oral Given 10/28/22 0911)    Initial Impression / Assessment and Plan / UC Course  I have reviewed the triage vital signs and the nursing notes.  Pertinent labs & imaging results that were available during my care of the patient were reviewed by me and considered in my medical decision making (see chart for details).  Vitals and triage reviewed, patient is febrile and tachycardic on presentation and given Tylenol.  Tolerating p.o. food and fluids.  Lungs are vesicular, oxygenation 98% on room air.  Subjective shortness of breath, chest x-ray suspicious for right upper lobe pneumonia.  Will cover with Augmentin and azithromycin per up-to-date recommendations.  Patient is a daily smoker of Black and milds.  Endorses urinary frequency, urinalysis with small bilirubin, small ketones, high specific gravity, moderate red blood cells, protein and trace leukocytes.  Will send for culture.  Negative for CVA tenderness.  Low concern for pyelonephritis at this time.  Abdomen is soft and nontender with active bowel sounds.  Did have loose stool this morning.  Low concern for acute abdominal pathology at this time.  Will treat with dual antibiotics for community-acquired pneumonia.  Temperature improved to 100.2 on recheck.  Will contact if labs require emergent follow-up.  Plan of care, follow-up care and strict emergency precautions discussed, no questions at this time.     Final Clinical Impressions(s) / UC Diagnoses   Final diagnoses:  Fever, unspecified fever cause  Community acquired pneumonia of right upper  lobe of lung     Discharge Instructions      Your x-ray showed that you have pneumonia in your right lung.  Please take all antibiotics as prescribed and until finished, take them with food to help prevent gastrointestinal upset.  Continue to use Tylenol as needed for fever.  Ensure  you are staying well-hydrated with at least 64 ounces of water, as your urine showed you are dehydrated.    You should see some improvements over the next 72 hours on antibiotics, if not please return to urgent care or seek immediate care at the nearest emergency department.  Follow-up with your primary care provider in the next week or so to ensure improvement.      ED Prescriptions     Medication Sig Dispense Auth. Provider   amoxicillin-clavulanate (AUGMENTIN) 875-125 MG tablet Take 1 tablet by mouth every 12 (twelve) hours. 14 tablet Rinaldo Ratel, [place] N, [place]   azithromycin (ZITHROMAX) 250 MG tablet Take 1 tablet (250 mg total) by mouth daily. Take first 2 tablets together, then 1 every day until finished. 6 tablet Gwendolynn Merkey, [place] N, [place]      PDMP not reviewed this encounter.   Hoby Kawai, [place] N, [place] 10/28/22 1028

## 2022-10-28 NOTE — Discharge Instructions (Addendum)
Your x-ray showed that you have pneumonia in your right lung.  Please take all antibiotics as prescribed and until finished, take them with food to help prevent gastrointestinal upset.  Continue to use Tylenol as needed for fever.  Ensure you are staying well-hydrated with at least 64 ounces of water, as your urine showed you are dehydrated.    You should see some improvements over the next 72 hours on antibiotics, if not please return to urgent care or seek immediate care at the nearest emergency department.  Follow-up with your primary care provider in the next week or so to ensure improvement.

## 2022-10-28 NOTE — Telephone Encounter (Signed)
Auth Submission: approved Site of care: Site of care: [facility] Payer: [employer] MEDICAID Medication & CPT/J Code(s) submitted: Remicade (Infliximab) J1745 Route of submission (phone, fax, portal):  Phone # [phone] Fax # Auth type: Buy/Bill PB Units/visits requested: 5MG /KG - 300MG  Q8WKS 1800 units Reference number: [id number] Approval from:10/26/22 -01/26/23

## 2022-10-28 NOTE — ED Triage Notes (Signed)
Pt c/o fever for 5 days with pain to rt leg. States had her first BM this am in 4 days. States decrease appetite. States taking tylenol and motrin around the clock with no relief. States last motrin at 11pm last night and a elderberry gummy this morning.

## 2022-10-29 LAB — URINE CULTURE: Culture: <10000 — AB

## 2022-10-31 ENCOUNTER — Encounter: Payer: Self-pay | Admitting: Physician Assistant

## 2022-10-31 ENCOUNTER — Encounter: Payer: Self-pay | Admitting: Gastroenterology

## 2022-11-02 ENCOUNTER — Encounter: Payer: Self-pay | Admitting: Gastroenterology

## 2022-11-02 NOTE — Addendum Note (Signed)
Addended by: Desma Mcgregor on: 11/02/2022 09:10 AM   Modules accepted: Orders

## 2022-11-07 ENCOUNTER — Other Ambulatory Visit (HOSPITAL_COMMUNITY): Payer: Self-pay

## 2022-11-08 ENCOUNTER — Other Ambulatory Visit: Payer: Self-pay

## 2022-11-09 ENCOUNTER — Other Ambulatory Visit: Payer: Self-pay

## 2022-11-09 ENCOUNTER — Ambulatory Visit: Payer: Medicaid Other

## 2022-11-09 VITALS — BP 127/87 | HR 94 | Temp 98.7°F | Resp 16 | Ht 65.0 in | Wt 146.0 lb

## 2022-11-09 DIAGNOSIS — K50919 Crohn's disease, unspecified, with unspecified complications: Secondary | ICD-10-CM | POA: Diagnosis not present

## 2022-11-09 MED ORDER — DIPHENHYDRAMINE HCL 25 MG PO CAPS
25.0000 mg | ORAL_CAPSULE | Freq: Once | ORAL | Status: AC
  Administered 2022-11-09: 25 mg via ORAL
  Filled 2022-11-09: qty 1

## 2022-11-09 MED ORDER — SODIUM CHLORIDE 0.9 % IV SOLN
5.0000 mg/kg | Freq: Once | INTRAVENOUS | Status: AC
  Administered 2022-11-09: 300 mg via INTRAVENOUS
  Filled 2022-11-09: qty 30

## 2022-11-09 MED ORDER — METHYLPREDNISOLONE SODIUM SUCC 40 MG IJ SOLR
40.0000 mg | Freq: Once | INTRAMUSCULAR | Status: AC
  Administered 2022-11-09: 40 mg via INTRAVENOUS
  Filled 2022-11-09: qty 1

## 2022-11-09 MED ORDER — ACETAMINOPHEN 325 MG PO TABS
650.0000 mg | ORAL_TABLET | Freq: Once | ORAL | Status: AC
  Administered 2022-11-09: 650 mg via ORAL
  Filled 2022-11-09: qty 2

## 2022-11-09 NOTE — Progress Notes (Shared)
Dawn Walsh    [id number]    January 21, 1976  Primary Care Physician:Edwards, Kinnie Scales, NP  Referring Physician: Grayce Sessions, NP [address] [place],  [place] [address]   Chief complaint: No chief complaint on file.   HPI: Dawn Walsh is a 47 y.o. female with history of Crohn's disease, GERD. Last seen on 08/01/22 by NP Alcide Evener for Crohn's disease.  Today, complains of abdominal pain ***. Taking pantoprazole 40 mg daily for GERD. Received Remicade infusion on 11/09/22.  Admitted to the hospital 05/23/2022 for anemia with a hemoglobin level of 5.9 per labs obtained by her PCP with persistent N/V, left sided abdominal pain and weight loss.   GI Hx:  EGD 05/24/2022: - Normal esophagus.  - Normal stomach. Biopsied.  - Gastroesophageal flap valve classified as Hill Grade III (minimal fold, loose to endoscope, hiatal hernia likely).  - Normal examined duodenum. Biopsied   Colonoscopy 05/24/2022: - Perianal skin tags found on perianal exam.  - Two ulcers in the cecum. Biopsied. - The entire examined colon is normal. -  Inflammation was found in the ileum rule out Crohn's disease. Biopsied. -  Non-bleeding internal hemorrhoids.  -  Anal papilla(e) were hypertrophied.    Current Outpatient Medications:    inFLIXimab 300 mg in sodium chloride 0.9 % 220 mL, Inject 300 mg into the vein every 8 (eight) weeks., Disp: , Rfl:    amoxicillin-clavulanate (AUGMENTIN) 875-125 MG tablet, Take 1 tablet by mouth every 12 (twelve) hours., Disp: 14 tablet, Rfl: 0   azithromycin (ZITHROMAX) 250 MG tablet, Take 1 tablet (250 mg total) by mouth daily. Take first 2 tablets together, then 1 every day until finished., Disp: 6 tablet, Rfl: 0   cyanocobalamin (VITAMIN B12) 250 MCG tablet, Take 1 tablet (250 mcg total) by mouth daily. (Patient taking differently: Take 250 mcg by mouth daily. Gummies), Disp: , Rfl:    ondansetron (ZOFRAN) 4 MG tablet, Take 1  tablet (4 mg total) by mouth every 8 (eight) hours as needed for nausea or vomiting., Disp: 20 tablet, Rfl: 0   pantoprazole (PROTONIX) 40 MG tablet, Take 1 tablet (40 mg total) by mouth daily., Disp: 30 tablet, Rfl: 2   promethazine (PHENERGAN) 12.5 MG tablet, Take 1 tablet (12.5 mg total) by mouth every 6 (six) hours as needed for nausea or vomiting., Disp: 30 tablet, Rfl: 0 No current facility-administered medications for this visit.  Facility-Administered Medications Ordered in Other Visits:    inFLIXimab (REMICADE) 5 mg/kg = 300 mg in sodium chloride 0.9 % 250 mL infusion, 5 mg/kg, Intravenous, Once, Nandigam, Kavitha V, MD, Last Rate: 10 mL/hr at 11/09/22 1052, 300 mg at 11/09/22 1052   Allergies as of 11/16/2022 - Review Complete 10/28/2022  Allergen Reaction Noted   Morphine and codeine Itching 06/01/2012    Past Medical History:  Diagnosis Date   Anemia 06/04/2021   Hypertension     Past Surgical History:  Procedure Laterality Date   BIOPSY  05/24/2022   Procedure: BIOPSY;  Surgeon: Shellia Cleverly, DO;  [facility];  Service: Gastroenterology;;   COLONOSCOPY N/A 05/24/2022   Procedure: COLONOSCOPY;  Surgeon: Shellia Cleverly, DO;  Location[facility];  Service: Gastroenterology;  Laterality: N/A;   ESOPHAGOGASTRODUODENOSCOPY N/A 05/24/2022   Procedure: ESOPHAGOGASTRODUODENOSCOPY (EGD);  Surgeon: Shellia Cleverly, DO;  Location: [facility] ENDOSCOPY;  Service: Gastroenterology;  Laterality: N/A;   TUBAL LIGATION  04/09/2000    Family  History  Problem Relation Age of Onset   Diabetes Mother    Breast cancer Mother    Colon cancer Neg Hx    Stomach cancer Neg Hx     Social History   Socioeconomic History   Marital status: Single    Spouse name: Not on file   Number of children: Not on file   Years of education: Not on file   Highest education level: Not on file  Occupational History   Not on file  Tobacco Use   Smoking status: Some Days    Types:  Cigars   Smokeless tobacco: Not on file  Vaping Use   Vaping status: Never Used  Substance and Sexual Activity   Alcohol use: No   Drug use: Yes    Frequency: 6.0 times per week    Types: Marijuana    Comment: every other day   Sexual activity: Yes    Birth control/protection: None  Other Topics Concern   Not on file  Social History Narrative   Not on file   Social Determinants of Health   Financial Resource Strain: Not on file  Food Insecurity: Not on file  Transportation Needs: Not on file  Physical Activity: Not on file  Stress: Not on file  Social Connections: Unknown (05/24/2021)   Received from [facility], [facility]   Social Network    Social Network: Not on file  Intimate Partner Violence: Unknown (04/30/2021)   Received from [facility], [facility]   HITS    Physically Hurt: Not on file    Insult or Talk Down To: Not on file    Threaten Physical Harm: Not on file    Scream or Curse: Not on file      Review of systems: Review of Systems  Constitutional:  Negative for unexpected weight change.  HENT:  Negative for trouble swallowing.   Gastrointestinal:  Negative for abdominal distention, abdominal pain, anal bleeding, blood in stool, constipation, diarrhea, nausea, rectal pain and vomiting.      Physical Exam: There were no vitals filed for this visit. There is no height or weight on file to calculate BMI.  General: well-appearing ***  Eyes: sclera anicteric, no redness ENT: oral mucosa moist without lesions, no cervical or supraclavicular lymphadenopathy CV: RRR, no JVD, no peripheral edema Resp: clear to auscultation bilaterally, normal RR and effort noted GI: soft, no tenderness, with active bowel sounds. No guarding or palpable organomegaly noted. Skin; warm and dry, no rash or jaundice noted Neuro: awake, alert and oriented x 3. Normal gross motor function and fluent speech   Data Reviewed:  Reviewed labs, radiology imaging, old  records and pertinent past GI work up   Assessment and Plan/Recommendations:  ***  This visit required *** minutes of patient care (this includes precharting, chart review, review of results, face-to-face time used for counseling as well as treatment plan and follow-up. The patient was provided an opportunity to ask questions and all were answered. The patient agreed with the plan and demonstrated an understanding of the instructions.  Iona Beard , MD  CC: Grayce Sessions, NP   I,Alexander Ruley,acting as a scribe for Marsa Aris, MD.,have documented all relevant documentation on the behalf of Marsa Aris, MD,as directed by  Marsa Aris, MD while in the presence of Marsa Aris, MD.   I, Marsa Aris, MD, have reviewed all documentation for this visit. The documentation on 11/09/22 for the exam, diagnosis, procedures, and orders are all  accurate and complete.

## 2022-11-09 NOTE — Progress Notes (Signed)
Diagnosis: Crohn's Disease  Provider:  Chilton Greathouse MD  Procedure: IV Infusion  IV Type: Peripheral, IV Location: R Forearm  Remicade (Infliximab), Dose: 300 mg  Infusion Start Time: 1052  Infusion Stop Time: 1307  Post Infusion IV Care: Peripheral IV Discontinued  Discharge: Condition: Good, Destination: Home . AVS Provided  Performed by:  Loney Hering, LPN

## 2022-11-10 ENCOUNTER — Telehealth: Payer: Self-pay

## 2022-11-10 ENCOUNTER — Other Ambulatory Visit: Payer: Self-pay

## 2022-11-10 DIAGNOSIS — K219 Gastro-esophageal reflux disease without esophagitis: Secondary | ICD-10-CM

## 2022-11-10 MED ORDER — PANTOPRAZOLE SODIUM 40 MG PO TBEC: 40.0000 mg | DELAYED_RELEASE_TABLET | Freq: Every day | ORAL | 2 refills | Status: DC

## 2022-11-10 NOTE — Telephone Encounter (Signed)
Thank you :)

## 2022-11-10 NOTE — Telephone Encounter (Signed)
Auth Submission: NO AUTH NEEDED Site of care: Site of care: [facility] Payer: [facility] Medicaid Medication & CPT/J Code(s) submitted: Remicade (Infliximab) J1745 Route of submission (phone, fax, portal): phone Phone # [phone] Fax # Auth type: Buy/Bill PB Units/visits requested: 300mg  x 2 doses Reference number: [id number] Approval from: 11/07/22 to 01/24/23

## 2022-11-10 NOTE — Telephone Encounter (Signed)
Contacted pt & pt is aware that refill for Pantoprazole was sent to pharmacy.

## 2022-11-16 ENCOUNTER — Ambulatory Visit: Payer: Medicaid Other | Admitting: Gastroenterology

## 2022-11-23 ENCOUNTER — Ambulatory Visit: Payer: Medicaid Other

## 2022-11-23 ENCOUNTER — Encounter: Payer: Self-pay | Admitting: Physician Assistant

## 2022-11-23 ENCOUNTER — Telehealth: Payer: Self-pay

## 2022-11-23 NOTE — Telephone Encounter (Signed)
Dawn Walsh, patient will be scheduled as soon as possible.  Auth Submission: NO AUTH NEEDED Site of care: Site of care: [facility] Payer: Medicaid [employer] Medication & CPT/J Code(s) submitted: Venofer (Iron Sucrose) J1756 Route of submission (phone, fax, portal):  Phone # Fax # Auth type: Buy/Bill PB Units/visits requested: 200mg  x 2 doses Reference number:  Approval from: 11/23/22 to 01/24/23

## 2022-11-25 ENCOUNTER — Telehealth: Payer: Self-pay | Admitting: Nurse Practitioner

## 2022-11-25 NOTE — Telephone Encounter (Signed)
Patient is aware of scheduled appointment times/dates for follow up

## 2022-11-29 NOTE — Telephone Encounter (Signed)
Colleen, please advise. 

## 2022-11-29 NOTE — Telephone Encounter (Signed)
DD, ok to send in RX for Promethazine 12.5 mg tab take one tab po Q 12 hrs PRN for Nausea and vomiting. # 30, 1 refill. THX.

## 2022-11-30 ENCOUNTER — Ambulatory Visit: Payer: Medicaid Other

## 2022-11-30 ENCOUNTER — Other Ambulatory Visit: Payer: Self-pay

## 2022-11-30 VITALS — BP 121/81 | HR 99 | Temp 98.2°F | Resp 16 | Ht 65.5 in | Wt 150.6 lb

## 2022-11-30 DIAGNOSIS — D508 Other iron deficiency anemias: Secondary | ICD-10-CM | POA: Diagnosis not present

## 2022-11-30 DIAGNOSIS — N920 Excessive and frequent menstruation with regular cycle: Secondary | ICD-10-CM

## 2022-11-30 DIAGNOSIS — D500 Iron deficiency anemia secondary to blood loss (chronic): Secondary | ICD-10-CM

## 2022-11-30 DIAGNOSIS — D649 Anemia, unspecified: Secondary | ICD-10-CM

## 2022-11-30 DIAGNOSIS — K50919 Crohn's disease, unspecified, with unspecified complications: Secondary | ICD-10-CM | POA: Diagnosis not present

## 2022-11-30 MED ORDER — DIPHENHYDRAMINE HCL 25 MG PO CAPS
25.0000 mg | ORAL_CAPSULE | Freq: Once | ORAL | Status: AC
  Administered 2022-11-30: 25 mg via ORAL

## 2022-11-30 MED ORDER — IRON SUCROSE 20 MG/ML IV SOLN
200.0000 mg | Freq: Once | INTRAVENOUS | Status: AC
  Administered 2022-11-30: 200 mg via INTRAVENOUS
  Filled 2022-11-30: qty 10

## 2022-11-30 MED ORDER — ACETAMINOPHEN 325 MG PO TABS: 650.0000 mg | ORAL_TABLET | Freq: Once | ORAL | Status: DC

## 2022-11-30 MED ORDER — PROMETHAZINE HCL 12.5 MG PO TABS: 12.5000 mg | ORAL_TABLET | Freq: Two times a day (BID) | ORAL | 1 refills | Status: DC | PRN

## 2022-11-30 NOTE — Progress Notes (Signed)
Diagnosis: Iron Deficiency Anemia  Provider:  Chilton Greathouse MD  Procedure: IV Push  IV Type: Peripheral, IV Location: R Hand  Venofer (Iron Sucrose), Dose: 200 mg  Medication was pushed over 10 minutes (stopping twice to push saline through due to complaints of burning at iv site. IV site has no noted swelling/tenderness/with good blood return). Patient requested me stop infusion with 3 mls left in syringe. Patient only received 7 of the 10 mls.   Post Infusion IV Care: Patient declined observation and Peripheral IV Discontinued  Discharge: Condition: Good, Destination: Home . AVS Declined  Performed by:  Loney Hering, LPN

## 2022-11-30 NOTE — Telephone Encounter (Signed)
Refill was sent to [facility] on [address]. Patient was notified.

## 2022-12-09 ENCOUNTER — Ambulatory Visit: Payer: Medicaid Other

## 2022-12-30 ENCOUNTER — Ambulatory Visit (INDEPENDENT_AMBULATORY_CARE_PROVIDER_SITE_OTHER): Payer: Medicaid Other

## 2022-12-30 VITALS — BP 148/84 | HR 105 | Temp 98.1°F | Resp 18 | Ht 65.5 in | Wt 153.0 lb

## 2022-12-30 DIAGNOSIS — K50919 Crohn's disease, unspecified, with unspecified complications: Secondary | ICD-10-CM | POA: Diagnosis not present

## 2022-12-30 DIAGNOSIS — D508 Other iron deficiency anemias: Secondary | ICD-10-CM

## 2022-12-30 DIAGNOSIS — D500 Iron deficiency anemia secondary to blood loss (chronic): Secondary | ICD-10-CM | POA: Diagnosis not present

## 2022-12-30 DIAGNOSIS — D649 Anemia, unspecified: Secondary | ICD-10-CM

## 2022-12-30 DIAGNOSIS — N920 Excessive and frequent menstruation with regular cycle: Secondary | ICD-10-CM | POA: Diagnosis not present

## 2022-12-30 MED ORDER — DIPHENHYDRAMINE HCL 25 MG PO CAPS
25.0000 mg | ORAL_CAPSULE | Freq: Once | ORAL | Status: AC
  Administered 2022-12-30: 25 mg via ORAL
  Filled 2022-12-30: qty 1

## 2022-12-30 MED ORDER — ACETAMINOPHEN 325 MG PO TABS
650.0000 mg | ORAL_TABLET | Freq: Once | ORAL | Status: AC
  Administered 2022-12-30: 325 mg via ORAL
  Filled 2022-12-30: qty 2

## 2022-12-30 MED ORDER — SODIUM CHLORIDE 0.9 % IV BOLUS
250.0000 mL | Freq: Once | INTRAVENOUS | Status: AC
  Administered 2022-12-30: 250 mL via INTRAVENOUS
  Filled 2022-12-30: qty 250

## 2022-12-30 MED ORDER — IRON SUCROSE 20 MG/ML IV SOLN
200.0000 mg | Freq: Once | INTRAVENOUS | Status: AC
  Administered 2022-12-30: 200 mg via INTRAVENOUS
  Filled 2022-12-30: qty 10

## 2022-12-30 NOTE — Progress Notes (Signed)
Diagnosis: Acute Anemia  Provider:  Chilton Greathouse MD  Procedure: IV Push  IV Type: Peripheral, IV Location: R Antecubital  Venofer (Iron Sucrose), Dose: 200 mg  Post Infusion IV Care: Patient declined observation and Peripheral IV Discontinued  Discharge: Condition: Good, Destination: Home . AVS Declined  Performed by:  Nat Math, RN    105 ml of NS was infused with the Venofer 200 mg IVP. Patient stated that she did not have any pain or burning during the infusion.

## 2022-12-30 NOTE — Addendum Note (Signed)
Addended by: Salli Real R on: 12/30/2022 09:59 AM   Modules accepted: Orders

## 2023-01-02 ENCOUNTER — Other Ambulatory Visit (HOSPITAL_COMMUNITY): Payer: Self-pay

## 2023-01-02 ENCOUNTER — Other Ambulatory Visit: Payer: Self-pay

## 2023-01-04 ENCOUNTER — Ambulatory Visit: Payer: Medicaid Other

## 2023-01-08 NOTE — Progress Notes (Deleted)
Patient Care Team: Grayce Sessions, NP as PCP - General (Internal Medicine)   CHIEF COMPLAINT: Follow up IDA  CURRENT THERAPY: IV iron PRN, due to poor tolerance to oral iron   INTERVAL HISTORY Ms. Eskelson returns for follow up as scheduled. Seen as new patient 08/12/22.   ROS   Past Medical History:  Diagnosis Date   Anemia 06/04/2021   Hypertension      Past Surgical History:  Procedure Laterality Date   BIOPSY  05/24/2022   Procedure: BIOPSY;  Surgeon: Shellia Cleverly, DO;  Location: [facility];  Service: Gastroenterology;;   COLONOSCOPY N/A 05/24/2022   Procedure: COLONOSCOPY;  Surgeon: Shellia Cleverly, DO;  Location: [facility];  Service: Gastroenterology;  Laterality: N/A;   ESOPHAGOGASTRODUODENOSCOPY N/A 05/24/2022   Procedure: ESOPHAGOGASTRODUODENOSCOPY (EGD);  Surgeon: Shellia Cleverly, DO;  Location: [facility] ENDOSCOPY;  Service: Gastroenterology;  Laterality: N/A;   TUBAL LIGATION  04/09/2000     Outpatient Encounter Medications as of 01/10/2023  Medication Sig   inFLIXimab 300 mg in sodium chloride 0.9 % 220 mL Inject 300 mg into the vein every 8 (eight) weeks.   amoxicillin-clavulanate (AUGMENTIN) 875-125 MG tablet Take 1 tablet by mouth every 12 (twelve) hours.   azithromycin (ZITHROMAX) 250 MG tablet Take 1 tablet (250 mg total) by mouth daily. Take first 2 tablets together, then 1 every day until finished.   cyanocobalamin (VITAMIN B12) 250 MCG tablet Take 1 tablet (250 mcg total) by mouth daily. (Patient taking differently: Take 250 mcg by mouth daily. Gummies)   ondansetron (ZOFRAN) 4 MG tablet Take 1 tablet (4 mg total) by mouth every 8 (eight) hours as needed for nausea or vomiting.   pantoprazole (PROTONIX) 40 MG tablet Take 1 tablet (40 mg total) by mouth daily.   promethazine (PHENERGAN) 12.5 MG tablet Take 1 tablet (12.5 mg total) by mouth every 12 (twelve) hours as needed for nausea or vomiting.   No facility-administered encounter medications  on file as of 01/10/2023.     There were no vitals filed for this visit. There is no height or weight on file to calculate BMI.   PHYSICAL EXAM GENERAL:alert, no distress and comfortable SKIN: no rash  EYES: sclera clear NECK: without mass LYMPH:  no palpable cervical or supraclavicular lymphadenopathy  LUNGS: clear with normal breathing effort HEART: regular rate & rhythm, no lower extremity edema ABDOMEN: abdomen soft, non-tender and normal bowel sounds NEURO: alert & oriented x 3 with fluent speech, no focal motor/sensory deficits Breast exam:  PAC without erythema    CBC    Component Value Date/Time   WBC 10.3 10/28/2022 1010   RBC 3.56 (L) 10/28/2022 1010   HGB 10.2 (L) 10/28/2022 1010   HGB 10.8 (L) 10/25/2022 1210   HCT 31.9 (L) 10/28/2022 1010   HCT 34.3 10/25/2022 1210   PLT 317 10/28/2022 1010   PLT 277 10/25/2022 1210   MCV 89.6 10/28/2022 1010   MCV 90 10/25/2022 1210   MCH 28.7 10/28/2022 1010   MCHC 32.0 10/28/2022 1010   RDW 20.5 (H) 10/28/2022 1010   RDW 18.9 (H) 10/25/2022 1210   LYMPHSABS 1.1 10/28/2022 1010   LYMPHSABS 1.3 05/20/2022 0852   MONOABS 0.9 10/28/2022 1010   EOSABS 0.2 10/28/2022 1010   EOSABS 0.2 05/20/2022 0852   BASOSABS 0.0 10/28/2022 1010   BASOSABS 0.0 05/20/2022 0852     CMP     Component Value Date/Time   NA 133 (L) 10/28/2022 1010  NA 142 05/19/2021 1050   K 3.8 10/28/2022 1010   CL 100 10/28/2022 1010   CO2 22 10/28/2022 1010   GLUCOSE 80 10/28/2022 1010   BUN 11 10/28/2022 1010   BUN 9 05/19/2021 1050   CREATININE 0.93 10/28/2022 1010   CREATININE 0.64 08/12/2022 1013   CALCIUM 8.8 (L) 10/28/2022 1010   PROT 6.7 10/28/2022 1010   PROT 6.0 05/19/2021 1050   ALBUMIN 2.6 (L) 10/28/2022 1010   ALBUMIN 3.5 (L) 05/19/2021 1050   AST 23 10/28/2022 1010   AST 10 (L) 08/12/2022 1013   ALT 25 10/28/2022 1010   ALT 8 08/12/2022 1013   ALKPHOS 59 10/28/2022 1010   BILITOT 1.0 10/28/2022 1010   BILITOT 0.4 08/12/2022  1013   GFRNONAA >60 10/28/2022 1010   GFRNONAA >60 08/12/2022 1013   GFRAA >90 06/01/2012 1210     ASSESSMENT & PLAN: 47 y.o. female who presents to the hematology clinic for iron deficiency anemia.    # Iron Deficiency Anemia 2/2 to GYN Bleeding and Crohn's disease -Work up c/w iron deficiency anemia secondary to patient's menorrhagia and malabsorption from Crohn's disease.  -Referred to gynecology in 07/2022 -Unable to tolerate PO iron due to GI discomfort.  -S/p IV Venofer 08/2022, 09/2022, 11/2022, and 12/30/22  PLAN:  No orders of the defined types were placed in this encounter.     All questions were answered. The patient knows to call the clinic with any problems, questions or concerns. No barriers to learning were detected. I spent *** counseling the patient face to face. The total time spent in the appointment was *** and more than 50% was on counseling, review of test results, and coordination of care.   Santiago Glad, NP-C @DATE @

## 2023-01-09 DIAGNOSIS — K219 Gastro-esophageal reflux disease without esophagitis: Secondary | ICD-10-CM

## 2023-01-10 ENCOUNTER — Inpatient Hospital Stay: Payer: Medicaid Other | Admitting: Nurse Practitioner

## 2023-01-10 ENCOUNTER — Inpatient Hospital Stay: Payer: Medicaid Other | Attending: Internal Medicine

## 2023-01-10 ENCOUNTER — Telehealth: Payer: Self-pay

## 2023-01-10 NOTE — Telephone Encounter (Signed)
Pt missed his appt today due to car trouble.  Staff message to scheduling to reschedule

## 2023-01-12 ENCOUNTER — Other Ambulatory Visit: Payer: Self-pay

## 2023-01-12 ENCOUNTER — Telehealth: Payer: Self-pay | Admitting: Nurse Practitioner

## 2023-01-12 MED ORDER — PROMETHAZINE HCL 12.5 MG PO TABS: 12.5000 mg | ORAL_TABLET | Freq: Two times a day (BID) | ORAL | 2 refills | Status: DC | PRN

## 2023-01-12 NOTE — Telephone Encounter (Signed)
Left patient a message in regards to rescheduled appointment times/dates

## 2023-01-24 ENCOUNTER — Ambulatory Visit: Payer: Medicaid Other

## 2023-01-24 VITALS — BP 142/91 | HR 83 | Temp 98.5°F | Resp 18 | Ht 65.0 in | Wt 153.0 lb

## 2023-01-24 DIAGNOSIS — K50919 Crohn's disease, unspecified, with unspecified complications: Secondary | ICD-10-CM

## 2023-01-24 MED ORDER — METHYLPREDNISOLONE SODIUM SUCC 40 MG IJ SOLR
40.0000 mg | Freq: Once | INTRAMUSCULAR | Status: AC
  Administered 2023-01-24: 40 mg via INTRAVENOUS
  Filled 2023-01-24: qty 1

## 2023-01-24 MED ORDER — ACETAMINOPHEN 325 MG PO TABS
650.0000 mg | ORAL_TABLET | Freq: Once | ORAL | Status: DC
Start: 2023-01-24 — End: 2023-01-24

## 2023-01-24 MED ORDER — SODIUM CHLORIDE 0.9 % IV SOLN
5.0000 mg/kg | Freq: Once | INTRAVENOUS | Status: AC
  Administered 2023-01-24: 400 mg via INTRAVENOUS
  Filled 2023-01-24: qty 40

## 2023-01-24 MED ORDER — DIPHENHYDRAMINE HCL 25 MG PO CAPS
25.0000 mg | ORAL_CAPSULE | Freq: Once | ORAL | Status: AC
  Administered 2023-01-24: 25 mg via ORAL
  Filled 2023-01-24: qty 1

## 2023-01-24 NOTE — Progress Notes (Signed)
 Diagnosis: Crohn's Disease  Provider:  Mannam, Praveen MD  Procedure: IV Infusion  IV Type: Peripheral, IV Location: R Antecubital  Remicade  (Infliximab ), Dose: 400 mg  Infusion Start Time: 0956  Infusion Stop Time: 1208  Patient refused Tylenol  pre-medication. Nurse educated patient and stressed the importance of taking pre-medications as a precaution in the event of a medication reaction. Patient verbalized understanding.   Patient stated she felt nauseous at the end of her infusion. Stated that she often feels nauseous following her infusions. Provided ginger ale and crackers to patient. Patient waited for an additional 15 minutes observation. Stated she felt better when her transportation arrived. Recommended to patient that she inform her ordering provider of her symptoms. Patient verbalized understanding.  Post Infusion IV Care: Peripheral IV Discontinued  Discharge: Condition: Good, Destination: Home . AVS Declined  Performed by:  Rocky FORBES Sar, RN

## 2023-02-09 NOTE — Telephone Encounter (Signed)
Please advise 

## 2023-02-10 ENCOUNTER — Other Ambulatory Visit: Payer: Self-pay | Admitting: Nurse Practitioner

## 2023-02-10 DIAGNOSIS — K219 Gastro-esophageal reflux disease without esophagitis: Secondary | ICD-10-CM

## 2023-02-10 MED ORDER — PANTOPRAZOLE SODIUM 40 MG PO TBEC: 40.0000 mg | DELAYED_RELEASE_TABLET | Freq: Every day | ORAL | 0 refills | Status: DC

## 2023-02-10 NOTE — Addendum Note (Signed)
Addended by: Richardson Chiquito on: 02/10/2023 10:44 AM   Modules accepted: Orders

## 2023-02-13 ENCOUNTER — Other Ambulatory Visit: Payer: Medicaid Other

## 2023-02-13 ENCOUNTER — Ambulatory Visit: Payer: Medicaid Other | Admitting: Nurse Practitioner

## 2023-02-13 NOTE — Progress Notes (Unsigned)
[facility] [phone]   [phone]  PROGRESS NOTE  Patient Care Team: Grayce Sessions, NP as PCP - General (Internal Medicine) Raymondo Band as Physician Assistant (Hematology and Oncology)  CHIEF COMPLAINT: Iron deficiency anemia.   TREATMENT HISTORY:  09/13/2022-12/30/2022: IV venofer 200 mg x 5 doses  HISTORY OF PRESENTING ILLNESS:  Dawn Walsh 48 y.o. female returns for a follow up for iron deficiency anemia. She was last seen on 08/12/2022 to establish care. In the interim, she received IV venofer 200 mg x 5 doses from 09/13/2022-12/30/2022.   On exam today, Dawn Walsh reports her energy levels have improved since receiving IV iron.  She did experience some nausea after each infusion which resolved on its own.  She denies any vomiting episodes with the infusion.  Patient reports that she has noticed heavier menstrual cycles the last couple months.  Each cycle last 5 days with 2 days of heavy bleeding.  She is no longer craving ice or cornstarch.  She denies fevers, chills, sweats, shortness of breath, chest pain, cough, headaches or dizziness.  She has no other complaints.  Rest of the 10 point ROS is below.   MEDICAL HISTORY:  Past Medical History:  Diagnosis Date   Anemia 06/04/2021   Hypertension     SURGICAL HISTORY: Past Surgical History:  Procedure Laterality Date   BIOPSY  05/24/2022   Procedure: BIOPSY;  Surgeon: Shellia Cleverly, DO;  Location: [facility];  Service: Gastroenterology;;   COLONOSCOPY N/A 05/24/2022   Procedure: COLONOSCOPY;  Surgeon: Shellia Cleverly, DO;  Location: [facility];  Service: Gastroenterology;  Laterality: N/A;   ESOPHAGOGASTRODUODENOSCOPY N/A 05/24/2022   Procedure: ESOPHAGOGASTRODUODENOSCOPY (EGD);  Surgeon: Shellia Cleverly, DO;  Location: [facility] ENDOSCOPY;  Service: Gastroenterology;  Laterality: N/A;   TUBAL LIGATION  04/09/2000    SOCIAL HISTORY: Social History    Socioeconomic History   Marital status: Single    Spouse name: Not on file   Number of children: Not on file   Years of education: Not on file   Highest education level: Not on file  Occupational History   Not on file  Tobacco Use   Smoking status: Some Days    Types: Cigars   Smokeless tobacco: Not on file  Vaping Use   Vaping status: Never Used  Substance and Sexual Activity   Alcohol use: No   Drug use: Yes    Frequency: 6.0 times per week    Types: Marijuana    Comment: every other day   Sexual activity: Yes    Birth control/protection: None  Other Topics Concern   Not on file  Social History Narrative   Not on file   Social Drivers of Health   Financial Resource Strain: Not on file  Food Insecurity: Not on file  Transportation Needs: Not on file  Physical Activity: Not on file  Stress: Not on file  Social Connections: Unknown (05/24/2021)   Received from [facility]   Social Network    Social Network: Not on file  Intimate Partner Violence: Unknown (04/30/2021)   Received from [facility], [facility]   HITS    Physically Hurt: Not on file    Insult or Talk Down To: Not on file    Threaten Physical Harm: Not on file    Scream or Curse: Not on file    FAMILY HISTORY: Family History  Problem Relation Age of Onset   Diabetes Mother  Breast cancer Mother    Colon cancer Neg Hx    Stomach cancer Neg Hx     ALLERGIES:  is allergic to morphine and codeine.  MEDICATIONS:  Current Outpatient Medications  Medication Sig Dispense Refill   azithromycin (ZITHROMAX) 250 MG tablet Take 1 tablet (250 mg total) by mouth daily. Take first 2 tablets together, then 1 every day until finished. 6 tablet 0   cyanocobalamin (VITAMIN B12) 250 MCG tablet Take 1 tablet (250 mcg total) by mouth daily. (Patient taking differently: Take 250 mcg by mouth daily. Gummies)     inFLIXimab 300 mg in sodium chloride 0.9 % 220 mL Inject 300 mg into the vein every  8 (eight) weeks.     ondansetron (ZOFRAN) 4 MG tablet Take 1 tablet (4 mg total) by mouth every 8 (eight) hours as needed for nausea or vomiting. 20 tablet 0   pantoprazole (PROTONIX) 40 MG tablet TAKE 1 TABLET(40 MG) BY MOUTH DAILY 90 tablet 1   promethazine (PHENERGAN) 12.5 MG tablet Take 1 tablet (12.5 mg total) by mouth every 12 (twelve) hours as needed for nausea or vomiting. 30 tablet 2   amoxicillin-clavulanate (AUGMENTIN) 875-125 MG tablet Take 1 tablet by mouth every 12 (twelve) hours. (Patient not taking: Reported on 02/14/2023) 14 tablet 0   No current facility-administered medications for this visit.    REVIEW OF SYSTEMS:   Constitutional: ( - ) fevers, ( - )  chills , ( - ) night sweats Eyes: ( - ) blurriness of vision, ( - ) double vision, ( - ) watery eyes Ears, nose, mouth, throat, and face: ( - ) mucositis, ( - ) sore throat Respiratory: ( - ) cough, (+ ) dyspnea, ( - ) wheezes Cardiovascular: ( - ) palpitation, ( - ) chest discomfort, ( - ) lower extremity swelling Gastrointestinal:  (+ ) nausea, ( - ) heartburn, ( - ) change in bowel habits Skin: ( - ) abnormal skin rashes Lymphatics: ( - ) new lymphadenopathy, ( - ) easy bruising Neurological: ( - ) numbness, ( - ) tingling, ( - ) new weaknesses Behavioral/Psych: ( - ) mood change, ( - ) new changes  All other systems were reviewed with the patient and are negative.  PHYSICAL EXAMINATION: ECOG PERFORMANCE STATUS: 1 - Symptomatic but completely ambulatory  Vitals:   02/14/23 0941 02/14/23 0944  BP: (!) 147/104 (!) 149/88  Pulse: 97   Resp: 20   Temp: 98.1 F (36.7 C)   SpO2: 100%     Filed Weights   02/14/23 0941  Weight: 156 lb 11.2 oz (71.1 kg)     GENERAL: well appearing female in NAD  SKIN: skin color, texture, turgor are normal, no rashes or significant lesions EYES: conjunctiva are pink and non-injected, sclera clear LUNGS: clear to auscultation and percussion with normal breathing effort HEART:  regular rate & rhythm and no murmurs and no lower extremity edema Musculoskeletal: no cyanosis of digits and no clubbing  PSYCH: alert & oriented x 3, fluent speech NEURO: no focal motor/sensory deficits  LABORATORY DATA:  I have reviewed the data as listed    Latest Ref Rng & Units 02/14/2023    9:25 AM 10/28/2022   10:10 AM 10/25/2022   12:10 PM  CBC  WBC 4.0 - 10.5 K/uL 5.8  10.3  11.8   Hemoglobin 12.0 - 15.0 g/dL 16.1  09.6  04.5   Hematocrit 36.0 - 46.0 % 35.6  31.9  34.3   Platelets  150 - 400 K/uL 273  317  277        Latest Ref Rng & Units 10/28/2022   10:10 AM 08/12/2022   10:13 AM 08/01/2022    9:34 AM  CMP  Glucose 70 - 99 mg/dL 80  75  77   BUN 6 - 20 mg/dL 11  8  9    Creatinine 0.44 - 1.00 mg/dL 7.82  9.56  2.13   Sodium 135 - 145 mmol/L 133  140  141   Potassium 3.5 - 5.1 mmol/L 3.8  3.5  4.3   Chloride 98 - 111 mmol/L 100  109  110   CO2 22 - 32 mmol/L 22  27  25    Calcium 8.9 - 10.3 mg/dL 8.8  9.0  9.1   Total Protein 6.5 - 8.1 g/dL 6.7  6.4  6.8   Total Bilirubin 0.3 - 1.2 mg/dL 1.0  0.4  0.4   Alkaline Phos 38 - 126 U/L 59  53  52   AST 15 - 41 U/L 23  10  11    ALT 0 - 44 U/L 25  8  7      RADIOGRAPHIC STUDIES: I have personally reviewed the radiological images as listed and agreed with the findings in the report. No results found.  ASSESSMENT & PLAN Dawn Walsh is a 48 y.o. female who presents to the hematology clinic for iron deficiency anemia.   # Iron Deficiency Anemia 2/2 to GYN Bleeding and Crohn's disease -- Findings are consistent with iron deficiency anemia secondary to patient's menorrhagia and malabsorption from Crohn's disease.  --Encouraged her to follow-up with OB/GYN for better control of her menstrual cycles. Referral sent to gynecology. --Unable to tolerate PO iron due to GI discomfort.  --Last received IV venofer 200 mg x 5 doses from 09/13/2022-12/30/2022.  --Labs today show improvement of anemia with hemoglobin of 11.1, MCV 89.4.   Iron panel shows some improvement with iron 32, TIBC 501, saturation 6%. Ferritin levels pending.  --Recommend another 3 doses of IV venofer 200 mg weekly to further boost iron and hemoglobin levels.  --Plan for return to clinic in 3 months for lab check in 6 months for labs/follow-up.  Orders Placed This Encounter  Procedures   CBC with Differential (Cancer Center Only)    Standing Status:   Standing    Number of Occurrences:   2    Expiration Date:   02/14/2024   Iron and Iron Binding Capacity ([facility] only)    Standing Status:   Standing    Number of Occurrences:   2    Expiration Date:   02/14/2024   Ferritin    Standing Status:   Standing    Number of Occurrences:   2    Expiration Date:   02/14/2024    All questions were answered. The patient knows to call the clinic with any problems, questions or concerns.  I have spent a total of 30 minutes minutes of face-to-face and non-face-to-face time, preparing to see the patient, obtaining and/or reviewing separately obtained history, performing a medically appropriate examination, counseling and educating the patient, ordering medications/tests/procedures, documenting clinical information in the electronic health record,and care coordination.   Georga Kaufmann, PA-C Department of Hematology/Oncology [facility] at [facility] Phone: [phone]

## 2023-02-14 ENCOUNTER — Other Ambulatory Visit: Payer: Self-pay

## 2023-02-14 ENCOUNTER — Telehealth: Payer: Self-pay

## 2023-02-14 ENCOUNTER — Inpatient Hospital Stay (HOSPITAL_BASED_OUTPATIENT_CLINIC_OR_DEPARTMENT_OTHER): Payer: Medicaid Other | Admitting: Physician Assistant

## 2023-02-14 ENCOUNTER — Inpatient Hospital Stay: Payer: Medicaid Other | Attending: Internal Medicine

## 2023-02-14 ENCOUNTER — Telehealth: Payer: Self-pay | Admitting: Pharmacy Technician

## 2023-02-14 VITALS — BP 149/88 | HR 97 | Temp 98.1°F | Resp 20 | Ht 65.0 in | Wt 156.7 lb

## 2023-02-14 DIAGNOSIS — K5090 Crohn's disease, unspecified, without complications: Secondary | ICD-10-CM | POA: Insufficient documentation

## 2023-02-14 DIAGNOSIS — D508 Other iron deficiency anemias: Secondary | ICD-10-CM

## 2023-02-14 DIAGNOSIS — D500 Iron deficiency anemia secondary to blood loss (chronic): Secondary | ICD-10-CM

## 2023-02-14 DIAGNOSIS — N920 Excessive and frequent menstruation with regular cycle: Secondary | ICD-10-CM

## 2023-02-14 DIAGNOSIS — K50919 Crohn's disease, unspecified, with unspecified complications: Secondary | ICD-10-CM

## 2023-02-14 DIAGNOSIS — D509 Iron deficiency anemia, unspecified: Secondary | ICD-10-CM | POA: Insufficient documentation

## 2023-02-14 DIAGNOSIS — Z720 Tobacco use: Secondary | ICD-10-CM | POA: Insufficient documentation

## 2023-02-14 LAB — CBC WITH DIFFERENTIAL (CANCER CENTER ONLY)
Abs Immature Granulocytes: 0.02 10*3/uL (ref 0.00–0.07)
Basophils Absolute: 0 10*3/uL (ref 0.0–0.1)
Basophils Relative: 1 %
Eosinophils Absolute: 0.1 10*3/uL (ref 0.0–0.5)
Eosinophils Relative: 2 %
HCT: 35.6 % — ABNORMAL LOW (ref 36.0–46.0)
Hemoglobin: 11.1 g/dL — ABNORMAL LOW (ref 12.0–15.0)
Immature Granulocytes: 0 %
Lymphocytes Relative: 27 %
Lymphs Abs: 1.6 10*3/uL (ref 0.7–4.0)
MCH: 27.9 pg (ref 26.0–34.0)
MCHC: 31.2 g/dL (ref 30.0–36.0)
MCV: 89.4 fL (ref 80.0–100.0)
Monocytes Absolute: 0.4 10*3/uL (ref 0.1–1.0)
Monocytes Relative: 8 %
Neutro Abs: 3.6 10*3/uL (ref 1.7–7.7)
Neutrophils Relative %: 62 %
Platelet Count: 273 10*3/uL (ref 150–400)
RBC: 3.98 MIL/uL (ref 3.87–5.11)
RDW: 15.2 % (ref 11.5–15.5)
WBC Count: 5.8 10*3/uL (ref 4.0–10.5)
nRBC: 0 % (ref 0.0–0.2)

## 2023-02-14 LAB — FERRITIN: Ferritin: 5 ng/mL — ABNORMAL LOW (ref 11–307)

## 2023-02-14 LAB — IRON AND IRON BINDING CAPACITY (CC-WL,HP ONLY)
Iron: 32 ug/dL (ref 28–170)
Saturation Ratios: 6 % — ABNORMAL LOW (ref 10.4–31.8)
TIBC: 501 ug/dL — ABNORMAL HIGH (ref 250–450)
UIBC: 469 ug/dL — ABNORMAL HIGH (ref 148–442)

## 2023-02-14 NOTE — Telephone Encounter (Signed)
Auth Submission: NO AUTH NEEDED Site of care: Site of care: [facility] Payer: medicaid Medication & CPT/J Code(s) submitted: Venofer (Iron Sucrose) J1756 Route of submission (phone, fax, portal):  Phone # Fax # Auth type: Buy/Bill PB Units/visits requested: 3 doses Reference number:  Approval from: 02/14/23 to 07/15/23

## 2023-02-14 NOTE — Telephone Encounter (Signed)
Referral faxed to  [facility] for [facility].  Confirmation received

## 2023-02-14 NOTE — Telephone Encounter (Signed)
-----   Message from Briant Cedar sent at 02/14/2023 11:22 AM EST ----- Please notify patient that iron levels have improved but still low. Recommend another 3 doses of venofer at [employer]. We will see her back in 3 months for lab check and 6 months for labs/follow up. ----- Message ----- From: Interface, Lab In [place] Sent: 02/14/2023   9:53 AM EST To: Briant Cedar, PA-C

## 2023-02-14 NOTE — Telephone Encounter (Signed)
Pt advised with VU and appt has been made for 02/22/23 at [facility].

## 2023-02-14 NOTE — Telephone Encounter (Signed)
Dawn Walsh, patient will be scheduled as soon as possible.  Auth Submission: NO AUTH NEEDED Site of care: Site of care: [facility] Payer: [employer] Medicaid Medication & CPT/J Code(s) submitted: Venofer (Iron Sucrose) J1756 Route of submission (phone, fax, portal):  Phone # Fax # Auth type: Buy/Bill PB Units/visits requested: 200mg  x 3 doses Reference number:  Approval from: 02/14/23 to 07/24/23

## 2023-02-22 MED ORDER — DIPHENHYDRAMINE HCL 25 MG PO CAPS: 25.0000 mg | ORAL_CAPSULE | Freq: Once | ORAL | Status: DC

## 2023-02-22 MED ORDER — IRON SUCROSE 20 MG/ML IV SOLN: 200.0000 mg | Freq: Once | INTRAVENOUS | Status: DC

## 2023-02-22 MED ORDER — ONDANSETRON HCL 8 MG PO TABS
8.0000 mg | ORAL_TABLET | Freq: Once | ORAL | Status: DC
  Filled 2023-02-22: qty 1

## 2023-02-22 MED ORDER — ACETAMINOPHEN 325 MG PO TABS: 650.0000 mg | ORAL_TABLET | Freq: Once | ORAL | Status: DC

## 2023-03-01 ENCOUNTER — Ambulatory Visit: Payer: Medicaid Other

## 2023-03-01 VITALS — BP 169/89 | HR 90 | Temp 97.9°F | Resp 20 | Ht 65.0 in | Wt 158.0 lb

## 2023-03-01 DIAGNOSIS — N920 Excessive and frequent menstruation with regular cycle: Secondary | ICD-10-CM

## 2023-03-01 DIAGNOSIS — D500 Iron deficiency anemia secondary to blood loss (chronic): Secondary | ICD-10-CM

## 2023-03-01 DIAGNOSIS — D508 Other iron deficiency anemias: Secondary | ICD-10-CM

## 2023-03-01 DIAGNOSIS — K50919 Crohn's disease, unspecified, with unspecified complications: Secondary | ICD-10-CM

## 2023-03-01 MED ORDER — ONDANSETRON 4 MG PO TBDP
ORAL_TABLET | ORAL | Status: AC
  Administered 2023-03-01: 4 mg
  Filled 2023-03-01: qty 1

## 2023-03-01 MED ORDER — ONDANSETRON 4 MG PO TBDP: 8.0000 mg | ORAL_TABLET | Freq: Once | ORAL | Status: DC

## 2023-03-01 MED ORDER — ONDANSETRON HCL 8 MG PO TABS
8.0000 mg | ORAL_TABLET | Freq: Once | ORAL | Status: DC
  Filled 2023-03-01: qty 1

## 2023-03-01 MED ORDER — IRON SUCROSE 20 MG/ML IV SOLN
200.0000 mg | Freq: Once | INTRAVENOUS | Status: AC
  Administered 2023-03-01: 200 mg via INTRAVENOUS
  Filled 2023-03-01: qty 10

## 2023-03-01 MED ORDER — DIPHENHYDRAMINE HCL 25 MG PO CAPS
25.0000 mg | ORAL_CAPSULE | Freq: Once | ORAL | Status: AC
  Administered 2023-03-01: 25 mg via ORAL
  Filled 2023-03-01: qty 1

## 2023-03-01 MED ORDER — ACETAMINOPHEN 325 MG PO TABS
650.0000 mg | ORAL_TABLET | Freq: Once | ORAL | Status: DC
Start: 2023-03-01 — End: 2023-03-01

## 2023-03-01 NOTE — Progress Notes (Signed)
 Diagnosis: Iron  Deficiency Anemia  Provider:  Praveen Mannam MD  Procedure: IV Push  IV Type: Peripheral, IV Location: R Antecubital  Venofer  (Iron  Sucrose), Dose: 200 mg  Post Infusion IV Care: Observation period completed and Peripheral IV Discontinued  Patient refused to take Tylenol . Nurse educated patient and stressed the importance of taking pre-medications as a precaution in the event of a medication reaction. Patient verbalized understanding.  Discharge: Condition: Good, Destination: Home . AVS Declined  Performed by:  Ramsie Ostrander, RN

## 2023-03-08 ENCOUNTER — Ambulatory Visit: Payer: Medicaid Other

## 2023-03-08 MED ORDER — DIPHENHYDRAMINE HCL 25 MG PO CAPS: 25.0000 mg | ORAL_CAPSULE | Freq: Once | ORAL | Status: DC

## 2023-03-08 MED ORDER — ACETAMINOPHEN 325 MG PO TABS
650.0000 mg | ORAL_TABLET | Freq: Once | ORAL | Status: DC
Start: 2023-03-08 — End: 2023-03-09

## 2023-03-08 MED ORDER — IRON SUCROSE 20 MG/ML IV SOLN
200.0000 mg | Freq: Once | INTRAVENOUS | Status: DC
Start: 2023-03-08 — End: 2023-03-09

## 2023-03-08 MED ORDER — ONDANSETRON HCL 8 MG PO TABS
8.0000 mg | ORAL_TABLET | Freq: Once | ORAL | Status: DC
Start: 2023-03-08 — End: 2023-03-09
  Filled 2023-03-08: qty 1

## 2023-03-09 ENCOUNTER — Encounter: Payer: Self-pay | Admitting: Gastroenterology

## 2023-03-09 ENCOUNTER — Encounter: Payer: Self-pay | Admitting: Physician Assistant

## 2023-03-14 ENCOUNTER — Ambulatory Visit (INDEPENDENT_AMBULATORY_CARE_PROVIDER_SITE_OTHER): Payer: Medicaid Other | Admitting: Gastroenterology

## 2023-03-14 ENCOUNTER — Other Ambulatory Visit (INDEPENDENT_AMBULATORY_CARE_PROVIDER_SITE_OTHER): Payer: Medicaid Other

## 2023-03-14 ENCOUNTER — Encounter: Payer: Self-pay | Admitting: Gastroenterology

## 2023-03-14 VITALS — BP 140/90 | HR 100 | Ht 65.5 in | Wt 158.0 lb

## 2023-03-14 DIAGNOSIS — K219 Gastro-esophageal reflux disease without esophagitis: Secondary | ICD-10-CM

## 2023-03-14 DIAGNOSIS — F17290 Nicotine dependence, other tobacco product, uncomplicated: Secondary | ICD-10-CM | POA: Diagnosis not present

## 2023-03-14 DIAGNOSIS — F17200 Nicotine dependence, unspecified, uncomplicated: Secondary | ICD-10-CM | POA: Diagnosis not present

## 2023-03-14 DIAGNOSIS — D500 Iron deficiency anemia secondary to blood loss (chronic): Secondary | ICD-10-CM | POA: Diagnosis not present

## 2023-03-14 DIAGNOSIS — R110 Nausea: Secondary | ICD-10-CM

## 2023-03-14 DIAGNOSIS — K5000 Crohn's disease of small intestine without complications: Secondary | ICD-10-CM | POA: Diagnosis not present

## 2023-03-14 DIAGNOSIS — F1290 Cannabis use, unspecified, uncomplicated: Secondary | ICD-10-CM

## 2023-03-14 LAB — COMPREHENSIVE METABOLIC PANEL
ALT: 11 U/L (ref 0–35)
AST: 15 U/L (ref 0–37)
Albumin: 3.6 g/dL (ref 3.5–5.2)
Alkaline Phosphatase: 57 U/L (ref 39–117)
BUN: 9 mg/dL (ref 6–23)
CO2: 28 meq/L (ref 19–32)
Calcium: 8.6 mg/dL (ref 8.4–10.5)
Chloride: 108 meq/L (ref 96–112)
Creatinine, Ser: 0.68 mg/dL (ref 0.40–1.20)
GFR: 105.15 mL/min (ref >60.00)
Glucose, Bld: 78 mg/dL (ref 70–99)
Potassium: 3.5 meq/L (ref 3.5–5.1)
Sodium: 142 meq/L (ref 135–145)
Total Bilirubin: 0.5 mg/dL (ref 0.2–1.2)
Total Protein: 6.5 g/dL (ref 6.0–8.3)

## 2023-03-14 LAB — CBC WITH DIFFERENTIAL/PLATELET
Basophils Absolute: 0 10*3/uL (ref 0.0–0.1)
Basophils Relative: 0.9 % (ref 0.0–3.0)
Eosinophils Absolute: 0.2 10*3/uL (ref 0.0–0.7)
Eosinophils Relative: 3.1 % (ref 0.0–5.0)
HCT: 36.7 % (ref 36.0–46.0)
Hemoglobin: 11.8 g/dL — ABNORMAL LOW (ref 12.0–15.0)
Lymphocytes Relative: 25 % (ref 12.0–46.0)
Lymphs Abs: 1.4 10*3/uL (ref 0.7–4.0)
MCHC: 32.2 g/dL (ref 30.0–36.0)
MCV: 87 fL (ref 78.0–100.0)
Monocytes Absolute: 0.4 10*3/uL (ref 0.1–1.0)
Monocytes Relative: 6.7 % (ref 3.0–12.0)
Neutro Abs: 3.5 10*3/uL (ref 1.4–7.7)
Neutrophils Relative %: 64.3 % (ref 43.0–77.0)
Platelets: 267 10*3/uL (ref 150.0–400.0)
RBC: 4.22 Mil/uL (ref 3.87–5.11)
RDW: 17.1 % — ABNORMAL HIGH (ref 11.5–15.5)
WBC: 5.5 10*3/uL (ref 4.0–10.5)

## 2023-03-14 LAB — B12 AND FOLATE PANEL
Folate: 12.4 ng/mL (ref >5.9)
Vitamin B-12: 1028 pg/mL — ABNORMAL HIGH (ref 211–911)

## 2023-03-14 LAB — VITAMIN D 25 HYDROXY (VIT D DEFICIENCY, FRACTURES): VITD: <7 ng/mL — ABNORMAL LOW (ref 30.00–100.00)

## 2023-03-14 MED ORDER — PANTOPRAZOLE SODIUM 40 MG PO TBEC: 40.0000 mg | DELAYED_RELEASE_TABLET | Freq: Every day | ORAL | 1 refills | Status: DC

## 2023-03-14 MED ORDER — PROMETHAZINE HCL 12.5 MG PO TABS: 12.5000 mg | ORAL_TABLET | Freq: Two times a day (BID) | ORAL | 2 refills | Status: DC | PRN

## 2023-03-14 MED ORDER — ONDANSETRON HCL 4 MG PO TABS: 4.0000 mg | ORAL_TABLET | Freq: Three times a day (TID) | ORAL | 2 refills | Status: AC | PRN

## 2023-03-14 NOTE — Progress Notes (Signed)
 Dawn Walsh    [id number]    Nov 30, 1975  Primary Care Physician:Edwards, Kinnie Scales, NP  Referring Physician: Grayce Sessions, NP [address] [place],  [place] [address]   Chief complaint:  Crohn's disease  Discussed the use of AI scribe software for clinical note transcription with the patient, who gave verbal consent to proceed.  History of Present Illness   Dawn Walsh is a 48 year old female with Crohn's disease who presents for follow-up regarding her infusion therapy.  She is undergoing infliximab infusion therapy for Crohn's disease, which she finds generally effective. She experiences variability in symptoms based on her diet, with pain being the most bothersome. No blood in stool, black tarry stools, or diarrhea. She has gained weight, attributing it to improved nutrition from the infusions, and reports regular bowel movements most days.  She is currently taking Protonix, Zofran, and promethazine for nausea, with promethazine taken at night due to its sedative effects.  In terms of social history, she has reduced her smoking, which includes marijuana and 'Blackie Mouths' cigars. She acknowledges that marijuana can cause nausea and vomiting, and cigars can exacerbate Crohn's disease and increase cancer risk.       Colonoscopy 05/24/2022 - Perianal skin tags found on perianal exam. - Two ulcers in the cecum. Biopsied. - The entire examined colon is normal. - Inflammation was found in the ileum rule out Crohn' s disease. Biopsied. - Non- bleeding internal hemorrhoids. - Anal papilla( e) were hypertrophied.  Outpatient Encounter Medications as of 03/14/2023  Medication Sig   cyanocobalamin (VITAMIN B12) 250 MCG tablet Take 1 tablet (250 mcg total) by mouth daily. (Patient taking differently: Take 250 mcg by mouth daily. Gummies)   inFLIXimab 300 mg in sodium chloride 0.9 % 220 mL Inject 300 mg into the vein every 8 (eight) weeks.    [DISCONTINUED] ondansetron (ZOFRAN) 4 MG tablet Take 1 tablet (4 mg total) by mouth every 8 (eight) hours as needed for nausea or vomiting.   [DISCONTINUED] pantoprazole (PROTONIX) 40 MG tablet TAKE 1 TABLET(40 MG) BY MOUTH DAILY   [DISCONTINUED] promethazine (PHENERGAN) 12.5 MG tablet Take 1 tablet (12.5 mg total) by mouth every 12 (twelve) hours as needed for nausea or vomiting.   ondansetron (ZOFRAN) 4 MG tablet Take 1 tablet (4 mg total) by mouth every 8 (eight) hours as needed for nausea or vomiting.   pantoprazole (PROTONIX) 40 MG tablet Take 1 tablet (40 mg total) by mouth daily.   promethazine (PHENERGAN) 12.5 MG tablet Take 1 tablet (12.5 mg total) by mouth every 12 (twelve) hours as needed for nausea or vomiting.   Facility-Administered Encounter Medications as of 03/14/2023  Medication   acetaminophen (TYLENOL) tablet 650 mg   diphenhydrAMINE (BENADRYL) capsule 25 mg   iron sucrose (VENOFER) injection 200 mg   ondansetron (ZOFRAN) tablet 8 mg    Allergies as of 03/14/2023 - Review Complete 03/14/2023  Allergen Reaction Noted   Morphine and codeine Itching 06/01/2012    Past Medical History:  Diagnosis Date   Anemia 06/04/2021   Hypertension     Past Surgical History:  Procedure Laterality Date   BIOPSY  05/24/2022   Procedure: BIOPSY;  Surgeon: Shellia Cleverly, DO;  Location: [facility];  Service: Gastroenterology;;   COLONOSCOPY N/A 05/24/2022   Procedure: COLONOSCOPY;  Surgeon: Shellia Cleverly, DO;  Location: [facility];  Service: Gastroenterology;  Laterality: N/A;   ESOPHAGOGASTRODUODENOSCOPY  N/A 05/24/2022   Procedure: ESOPHAGOGASTRODUODENOSCOPY (EGD);  Surgeon: Shellia Cleverly, DO;  Location: [facility] ENDOSCOPY;  Service: Gastroenterology;  Laterality: N/A;   TUBAL LIGATION  04/09/2000    Family History  Problem Relation Age of Onset   Diabetes Mother    Breast cancer Mother    Colon cancer Neg Hx    Stomach cancer Neg Hx     Social History    Socioeconomic History   Marital status: Single    Spouse name: Not on file   Number of children: Not on file   Years of education: Not on file   Highest education level: Not on file  Occupational History   Not on file  Tobacco Use   Smoking status: Some Days    Types: Cigars   Smokeless tobacco: Not on file  Vaping Use   Vaping status: Never Used  Substance and Sexual Activity   Alcohol use: No   Drug use: Yes    Frequency: 6.0 times per week    Types: Marijuana    Comment: every other day   Sexual activity: Yes    Birth control/protection: None  Other Topics Concern   Not on file  Social History Narrative   Not on file   Social Drivers of Health   Financial Resource Strain: Not on file  Food Insecurity: Not on file  Transportation Needs: Not on file  Physical Activity: Not on file  Stress: Not on file  Social Connections: Unknown (05/24/2021)   Received from [facility], [facility]   Social Network    Social Network: Not on file  Intimate Partner Violence: Unknown (04/30/2021)   Received from [facility], [facility]   HITS    Physically Hurt: Not on file    Insult or Talk Down To: Not on file    Threaten Physical Harm: Not on file    Scream or Curse: Not on file      Review of systems: All other review of systems negative except as mentioned in the HPI.   Physical Exam: Vitals:   03/14/23 1041 03/14/23 1119  BP: (!) 150/90 (!) 140/90  Pulse: 100    Body mass index is 25.89 kg/m. Gen:      No acute distress HEENT:  sclera anicteric CV: s1s2 rrr, no murmur Lungs: B/l clear. Abd:      soft, non-tender; no palpable masses, no distension Ext:    No edema Neuro: alert and oriented x 3 Psych: normal mood and affect  Data Reviewed:  Reviewed labs, radiology imaging, old records and pertinent past GI work up     Assessment and Plan    Crohn's Disease Crohn's disease with improved symptoms on infliximab. No blood in stool, melena, or  diarrhea. Weight gain noted, indicating improved nutrient absorption. Occasional pain likely related to dietary intake. Discussed continuing infliximab to control inflammation and improve nutrient absorption. Advised to monitor for bleeding, pain, or diarrhea and return if symptoms worsen. - Order CMP, CBC, iron panel, B12, folic acid, and vitamin D levels - Continue infliximab infusions as scheduled - Schedule follow-up in six months  Nausea Intermittent nausea managed with Zofran and promethazine. Uses Zofran during the day and promethazine at night due to sedative effects. Discussed potential contribution of marijuana to nausea and advised to reduce use. - Refill Zofran and promethazine prescriptions  Smoking Cessation Smokes marijuana and cigars (Black [employer] Milds). Advised to reduce both due to potential exacerbation of Crohn's disease and increased cancer risk.  Marijuana may also contribute to nausea and weight gain. - Advise to reduce or avoid smoking cigars - Recommend cutting back on marijuana use  General Health Maintenance Discussed importance of hydration to prevent nausea and vomiting cycles. Advised to avoid excessive weight gain and maintain regular bowel movements. - Encourage to drink plenty of water - Advise to maintain current weight and avoid excessive weight gain  Follow-up - Schedule follow-up appointment in six months - Advise to return sooner if experiencing bleeding, pain, or diarrhea.       The patient was provided an opportunity to ask questions and all were answered. The patient agreed with the plan and demonstrated an understanding of the instructions.  Iona Beard , MD    CC: Grayce Sessions, NP

## 2023-03-14 NOTE — Patient Instructions (Addendum)
 VISIT SUMMARY:  Today, we discussed your ongoing treatment for Crohn's disease, including your current symptoms, medication regimen, and lifestyle factors. We reviewed your infusion therapy and its effectiveness, as well as your experience with nausea and smoking habits.  YOUR PLAN:  -CROHN'S DISEASE: Crohn's disease is a chronic inflammatory condition of the gastrointestinal tract. Your symptoms have improved with infliximab therapy, and you have gained weight, indicating better nutrient absorption. We will continue your infliximab infusions and monitor your symptoms. Please watch for any signs of bleeding, pain, or diarrhea and report them if they occur. We have ordered blood tests to check your overall health and nutrient levels. Your next follow-up is in six months.  -NAUSEA: Nausea is a feeling of sickness with an inclination to vomit. You are managing it with Zofran during the day and promethazine at night. We discussed that marijuana might be contributing to your nausea, and you are advised to reduce its use. Your prescriptions for Zofran and promethazine have been refilled.  -SMOKING CESSATION: Smoking, including marijuana and cigars, can worsen Crohn's disease and increase cancer risk. Marijuana may also contribute to your nausea. You are advised to reduce or avoid smoking cigars and cut back on marijuana use.  -GENERAL HEALTH MAINTENANCE: Maintaining good hydration is important to prevent nausea and vomiting. You are encouraged to drink plenty of water, maintain your current weight, and avoid excessive weight gain.  INSTRUCTIONS:  Please schedule a follow-up appointment in six months. Return sooner if you experience any bleeding, pain, or diarrhea. We have ordered blood tests (CMP, CBC, TB gold,  B12, folic acid, and vitamin D levels) to monitor your health.  Your provider has requested that you go to the basement level for lab work before leaving today. Press "B" on the elevator. The lab  is located at the first door on the left as you exit the elevator.   I appreciate the  opportunity to care for you  Thank You   Marsa Aris , MD

## 2023-03-15 ENCOUNTER — Ambulatory Visit: Payer: Medicaid Other

## 2023-03-17 LAB — QUANTIFERON-TB GOLD PLUS
Mitogen-NIL: 7.58 [IU]/mL
NIL: 0.05 [IU]/mL
QuantiFERON-TB Gold Plus: NEGATIVE
TB1-NIL: 0 [IU]/mL
TB2-NIL: 0 [IU]/mL

## 2023-03-20 ENCOUNTER — Encounter: Payer: Self-pay | Admitting: Gastroenterology

## 2023-03-21 ENCOUNTER — Ambulatory Visit (INDEPENDENT_AMBULATORY_CARE_PROVIDER_SITE_OTHER): Payer: Medicaid Other

## 2023-03-21 VITALS — BP 147/97 | HR 102 | Temp 98.0°F | Resp 20 | Ht 65.5 in | Wt 157.6 lb

## 2023-03-21 DIAGNOSIS — K50919 Crohn's disease, unspecified, with unspecified complications: Secondary | ICD-10-CM

## 2023-03-21 MED ORDER — ACETAMINOPHEN 325 MG PO TABS: 650.0000 mg | ORAL_TABLET | Freq: Once | ORAL | Status: DC

## 2023-03-21 MED ORDER — METHYLPREDNISOLONE SODIUM SUCC 40 MG IJ SOLR
40.0000 mg | Freq: Once | INTRAMUSCULAR | Status: DC
  Filled 2023-03-21: qty 1

## 2023-03-21 MED ORDER — DIPHENHYDRAMINE HCL 25 MG PO CAPS
25.0000 mg | ORAL_CAPSULE | Freq: Once | ORAL | Status: AC
  Administered 2023-03-21: 25 mg via ORAL
  Filled 2023-03-21: qty 1

## 2023-03-21 NOTE — Progress Notes (Signed)
 Pt in for infusion.Unable to obtain IV access. Pt rescheduled visit.

## 2023-03-25 ENCOUNTER — Encounter: Payer: Self-pay | Admitting: Gastroenterology

## 2023-03-25 ENCOUNTER — Telehealth: Payer: Self-pay

## 2023-03-25 NOTE — Telephone Encounter (Signed)
 Auth Submission: NO AUTH NEEDED Site of care: Site of care: [facility] Payer: [facility] Medicaid Medication & CPT/J Code(s) submitted: Remicade (Infliximab) J1745 Route of submission (phone, fax, portal): [employer] look up Programmer, applications #  Fax # Auth type: Buy/Bill PB Units/visits requested: 5mg /kg every 8 weeks Reference number:  Approval from: 11/07/22 to 01/24/24

## 2023-03-31 ENCOUNTER — Ambulatory Visit: Payer: MEDICAID

## 2023-03-31 ENCOUNTER — Encounter (INDEPENDENT_AMBULATORY_CARE_PROVIDER_SITE_OTHER): Payer: Self-pay | Admitting: Primary Care

## 2023-03-31 VITALS — BP 165/94 | HR 93 | Temp 98.2°F | Resp 18 | Ht 65.0 in | Wt 159.6 lb

## 2023-03-31 DIAGNOSIS — N920 Excessive and frequent menstruation with regular cycle: Secondary | ICD-10-CM

## 2023-03-31 DIAGNOSIS — D500 Iron deficiency anemia secondary to blood loss (chronic): Secondary | ICD-10-CM | POA: Diagnosis not present

## 2023-03-31 MED ORDER — ACETAMINOPHEN 325 MG PO TABS: 650.0000 mg | ORAL_TABLET | Freq: Once | ORAL | Status: DC

## 2023-03-31 MED ORDER — IRON SUCROSE 20 MG/ML IV SOLN
200.0000 mg | Freq: Once | INTRAVENOUS | Status: AC
  Administered 2023-03-31: 200 mg via INTRAVENOUS
  Filled 2023-03-31: qty 10

## 2023-03-31 MED ORDER — ONDANSETRON 4 MG PO TBDP
4.0000 mg | ORAL_TABLET | Freq: Once | ORAL | Status: AC
  Administered 2023-03-31: 4 mg via ORAL
  Filled 2023-03-31: qty 1

## 2023-03-31 MED ORDER — DIPHENHYDRAMINE HCL 25 MG PO CAPS: 25.0000 mg | ORAL_CAPSULE | Freq: Once | ORAL | Status: DC

## 2023-03-31 NOTE — Progress Notes (Deleted)
 Diagnosis: {Diagnosis:25401}  Provider:  {[facility]MD:27988::"Praveen Mannam MD"}  Procedure: {Injection/Infusion:25339}  {IV Type:25393::"Peripheral"}, {IV Location:25394}  {Medications:25395}, {Dose:25397}  {Infusion Start Time:25399}  {Infusion Stop Time:25400}  Post Infusion IV Care: {[facility] Post Infusion:25398}  Discharge: {Condition:19696:::1}, {Destination:18313::"Home":1} . {CHINFAVS:28985}  Performed by:  Adriana Mccallum, RN

## 2023-03-31 NOTE — Progress Notes (Signed)
 Diagnosis: Iron Deficiency Anemia  Provider:  Chilton Greathouse MD  Procedure: IV Push  IV Type: Peripheral, IV Location: R Antecubital  Venofer (Iron Sucrose), Dose: 200 mg  Post Infusion IV Care: Patient declined observation and Peripheral IV Discontinued  Discharge: Condition: Good, Destination: Home . AVS Declined  Performed by:  Adriana Mccallum, RN    Patient refused pre-medications. Nurse educated patient and stressed the importance of taking pre-medications as a precaution in the event of a medication reaction. Patient verbalized understanding.

## 2023-04-05 ENCOUNTER — Ambulatory Visit (INDEPENDENT_AMBULATORY_CARE_PROVIDER_SITE_OTHER): Payer: MEDICAID

## 2023-04-05 VITALS — BP 161/99 | HR 83 | Temp 98.2°F | Resp 18 | Ht 65.0 in | Wt 160.2 lb

## 2023-04-05 DIAGNOSIS — K50919 Crohn's disease, unspecified, with unspecified complications: Secondary | ICD-10-CM | POA: Diagnosis not present

## 2023-04-05 MED ORDER — ACETAMINOPHEN 325 MG PO TABS
650.0000 mg | ORAL_TABLET | Freq: Once | ORAL | Status: AC
  Administered 2023-04-05: 650 mg via ORAL
  Filled 2023-04-05: qty 2

## 2023-04-05 MED ORDER — DIPHENHYDRAMINE HCL 25 MG PO CAPS
25.0000 mg | ORAL_CAPSULE | Freq: Once | ORAL | Status: AC
  Administered 2023-04-05: 25 mg via ORAL
  Filled 2023-04-05: qty 1

## 2023-04-05 MED ORDER — SODIUM CHLORIDE 0.9 % IV SOLN
5.0000 mg/kg | Freq: Once | INTRAVENOUS | Status: AC
  Administered 2023-04-05: 400 mg via INTRAVENOUS
  Filled 2023-04-05: qty 40

## 2023-04-05 MED ORDER — METHYLPREDNISOLONE SODIUM SUCC 40 MG IJ SOLR
40.0000 mg | Freq: Once | INTRAMUSCULAR | Status: AC
  Administered 2023-04-05: 40 mg via INTRAVENOUS
  Filled 2023-04-05: qty 1

## 2023-04-05 NOTE — Progress Notes (Signed)
 Diagnosis: Crohn's Disease  Provider:  Chilton Greathouse MD  Procedure: IV Infusion  IV Type: Peripheral, IV Location: R Antecubital  Remicade (Infliximab), Dose: 400 mg  Infusion Start Time: 0938  Infusion Stop Time: 1203  Post Infusion IV Care: Peripheral IV Discontinued  Discharge: Condition: Good, Destination: Home . AVS Provided  Performed by:  Adriana Mccallum, RN

## 2023-04-05 NOTE — Progress Notes (Signed)
 16

## 2023-04-06 ENCOUNTER — Ambulatory Visit
Admission: RE | Admit: 2023-04-06 | Discharge: 2023-04-06 | Disposition: A | Discharge status: Home / Self Care | Payer: MEDICAID | Source: Ambulatory Visit | Attending: Internal Medicine | Admitting: Internal Medicine

## 2023-04-06 ENCOUNTER — Other Ambulatory Visit: Payer: Self-pay

## 2023-04-06 VITALS — BP 164/103 | HR 93 | Temp 98.5°F | Resp 18

## 2023-04-06 DIAGNOSIS — I10 Essential (primary) hypertension: Secondary | ICD-10-CM

## 2023-04-06 MED ORDER — LOSARTAN POTASSIUM 25 MG PO TABS: 25.0000 mg | ORAL_TABLET | Freq: Every day | ORAL | 0 refills | Status: DC

## 2023-04-06 NOTE — Discharge Instructions (Signed)
 I have restarted your blood pressure medication.  Monitor blood pressure closely at home.  Go to the ER if you develop severe headache, chest pain, shortness of breath, dizziness, nausea, vomiting, blurred vision.  Keep follow-up appointment with PCP.

## 2023-04-06 NOTE — ED Triage Notes (Signed)
 I feel likey blood pressure is up yesterday I had an chrome infusion and It was up there it's actually been up every morning I wake up I have an appointment on the 25th with my doc but that's a week away I - Entered by patient   Pt has no way to measure at home but pt states she can feel that her BP is extremely elevated and has been for about 2 weeks.

## 2023-04-06 NOTE — ED Provider Notes (Signed)
 [facility]    CSN: [id number] Arrival date & time: 04/06/23  1137      History   Chief Complaint Chief Complaint  Patient presents with   Dizziness    I feel likey blood pressure is up yesterday I had an chrome infusion and It was up there it's actually been up every morning I wake up I have an appointment on the 25th with my doc but that's a week away I - Entered by patient    HPI Dawn Walsh is a 48 y.o. female.   Patient presents today given concerns of high blood pressure.  Reports that her blood pressure has been elevated at her medication infusion visits for her Crohn's disease recently.  She does not check her blood pressure at home.  Reports that she knows that it has been elevated because she has not felt well.  Patient states that she gets a "mild pressure" behind her eyes and on the left side of her head.  Denies severe headache, chest pain, shortness of breath, dizziness, blurred vision, nausea, vomiting.  Patient states that she previously took losartan but this was discontinued approximately 1 year ago by her gastroenterologist.  She states that she was told that she did not need it anymore.  She has an appointment with PCP on 3/25.   Dizziness   Past Medical History:  Diagnosis Date   Anemia 06/04/2021   Hypertension     Patient Active Problem List   Diagnosis Date Noted   Iron deficiency anemia 02/14/2023   Crohn's disease with complication (HCC) 08/05/2022   Ileitis 05/24/2022   Symptomatic anemia 05/23/2022   Abdominal pain 05/23/2022   Nausea and vomiting 05/23/2022   Unintentional weight loss 05/23/2022   Anemia 06/04/2021   LLQ pain 06/04/2021    Past Surgical History:  Procedure Laterality Date   BIOPSY  05/24/2022   Procedure: BIOPSY;  Surgeon: Shellia Cleverly, DO;  Location: [facility];  Service: Gastroenterology;;   COLONOSCOPY N/A 05/24/2022   Procedure: COLONOSCOPY;  Surgeon: Shellia Cleverly, DO;  Location: [facility]  ENDOSCOPY;  Service: Gastroenterology;  Laterality: N/A;   ESOPHAGOGASTRODUODENOSCOPY N/A 05/24/2022   Procedure: ESOPHAGOGASTRODUODENOSCOPY (EGD);  Surgeon: Shellia Cleverly, DO;  Location: [facility] ENDOSCOPY;  Service: Gastroenterology;  Laterality: N/A;   TUBAL LIGATION  04/09/2000    OB History   No obstetric history on file.      Home Medications    Prior to Admission medications   Medication Sig Start Date End Date Taking? Authorizing Provider  losartan (COZAAR) 25 MG tablet Take 1 tablet (25 mg total) by mouth daily. 04/06/23  Yes Adalis Gatti, Rolly Salter E, FNP  cyanocobalamin (VITAMIN B12) 250 MCG tablet Take 1 tablet (250 mcg total) by mouth daily. Patient taking differently: Take 250 mcg by mouth daily. Gummies 05/25/22   Elgergawy, Leana Roe, MD  inFLIXimab 300 mg in sodium chloride 0.9 % 220 mL Inject 300 mg into the vein every 8 (eight) weeks. 09/14/22   Napoleon Form, MD  ondansetron (ZOFRAN) 4 MG tablet Take 1 tablet (4 mg total) by mouth every 8 (eight) hours as needed for nausea or vomiting. 03/14/23   Napoleon Form, MD  pantoprazole (PROTONIX) 40 MG tablet Take 1 tablet (40 mg total) by mouth daily. 03/14/23   Napoleon Form, MD  promethazine (PHENERGAN) 12.5 MG tablet Take 1 tablet (12.5 mg total) by mouth every 12 (twelve) hours as needed for nausea or vomiting. 03/14/23   Marsa Aris  V, MD    Family History Family History  Problem Relation Age of Onset   Diabetes Mother    Breast cancer Mother    Colon cancer Neg Hx    Stomach cancer Neg Hx     Social History Social History   Tobacco Use   Smoking status: Some Days    Types: Cigars  Vaping Use   Vaping status: Never Used  Substance Use Topics   Alcohol use: No   Drug use: Yes    Frequency: 6.0 times per week    Types: Marijuana    Comment: every other day     Allergies   Morphine and codeine   Review of Systems Review of Systems Per HPI  Physical Exam Triage Vital Signs ED Triage Vitals   Encounter Vitals Group     BP 04/06/23 1147 (!) 180/112     Systolic BP Percentile --      Diastolic BP Percentile --      Pulse Rate 04/06/23 1147 93     Resp 04/06/23 1147 18     Temp 04/06/23 1147 98.5 F (36.9 C)     Temp Source 04/06/23 1147 Oral     SpO2 04/06/23 1147 100 %     Weight --      Height --      Head Circumference --      Peak Flow --      Pain Score 04/06/23 1146 8     Pain Loc --      Pain Education --      Exclude from Growth Chart --    No data found.  Updated Vital Signs BP (!) 164/103 (BP Location: Left Arm)   Pulse 93   Temp 98.5 F (36.9 C) (Oral)   Resp 18   LMP 04/06/2023 (Exact Date)   SpO2 100%   Visual Acuity Right Eye Distance:   Left Eye Distance:   Bilateral Distance:    Right Eye Near:   Left Eye Near:    Bilateral Near:     Physical Exam Constitutional:      General: She is not in acute distress.    Appearance: Normal appearance. She is not toxic-appearing or diaphoretic.  HENT:     Head: Normocephalic and atraumatic.  Eyes:     Extraocular Movements: Extraocular movements intact.     Conjunctiva/sclera: Conjunctivae normal.     Pupils: Pupils are equal, round, and reactive to light.  Cardiovascular:     Rate and Rhythm: Normal rate and regular rhythm.     Pulses: Normal pulses.     Heart sounds: Normal heart sounds.  Pulmonary:     Effort: Pulmonary effort is normal. No respiratory distress.     Breath sounds: Normal breath sounds.  Neurological:     General: No focal deficit present.     Mental Status: She is alert and oriented to person, place, and time. Mental status is at baseline.     Cranial Nerves: Cranial nerves 2-12 are intact.     Sensory: Sensation is intact.     Motor: Motor function is intact.     Coordination: Coordination is intact.     Gait: Gait is intact.  Psychiatric:        Mood and Affect: Mood normal.        Behavior: Behavior normal.        Thought Content: Thought content normal.         Judgment: Judgment normal.  UC Treatments / Results  Labs (all labs ordered are listed, but only abnormal results are displayed) Labs Reviewed  BASIC METABOLIC PANEL    EKG   Radiology No results found.  Procedures Procedures (including critical care time)  Medications Ordered in UC Medications - No data to display  Initial Impression / Assessment and Plan / UC Course  I have reviewed the triage vital signs and the nursing notes.  Pertinent labs & imaging results that were available during my care of the patient were reviewed by me and considered in my medical decision making (see chart for details).     Blood pressure is elevated today.  Rechecks are slightly improved.  After review of the chart, it does appear the patient's blood pressure has been elevated over the past few weeks at her infusion visits.  Neuroexam is normal and patient does not have a severe headache so do not think that emergent evaluation is necessary.  Although, patient was given strict ER precautions.  After further review of the chart, there does not appear to be a documented reason that losartan was previously discontinued for blood pressure.  Therefore, will refill this medication at 25 mg daily for 30 days and patient was encouraged to monitor blood pressure closely at home.  Encouraged strict return and ER precautions and for patient to keep appointment with PCP.  BMP also pending.  Patient verbalized understanding and was agreeable with plan. Final Clinical Impressions(s) / UC Diagnoses   Final diagnoses:  Hypertension, unspecified type     Discharge Instructions      I have restarted your blood pressure medication.  Monitor blood pressure closely at home.  Go to the ER if you develop severe headache, chest pain, shortness of breath, dizziness, nausea, vomiting, blurred vision.  Keep follow-up appointment with PCP.    ED Prescriptions     Medication Sig Dispense Auth. Provider    losartan (COZAAR) 25 MG tablet Take 1 tablet (25 mg total) by mouth daily. 30 tablet [place], Acie Fredricks[place], [place]      PDMP not reviewed this encounter.   Gustavus Bryant, [place] 04/06/23 1308

## 2023-04-07 LAB — BASIC METABOLIC PANEL
BUN/Creatinine Ratio: 13 (ref 9–23)
BUN: 9 mg/dL (ref 6–24)
CO2: 22 mmol/L (ref 20–29)
Calcium: 9.1 mg/dL (ref 8.7–10.2)
Chloride: 106 mmol/L (ref 96–106)
Creatinine, Ser: 0.67 mg/dL (ref 0.57–1.00)
Glucose: 77 mg/dL (ref 70–99)
Potassium: 4 mmol/L (ref 3.5–5.2)
Sodium: 141 mmol/L (ref 134–144)
eGFR: 110 mL/min/{1.73_m2} (ref >59)

## 2023-04-11 ENCOUNTER — Telehealth (INDEPENDENT_AMBULATORY_CARE_PROVIDER_SITE_OTHER): Payer: Self-pay | Admitting: Primary Care

## 2023-04-11 NOTE — Telephone Encounter (Signed)
 Left VM with pt about their upcoming appt.

## 2023-04-14 MED ORDER — METHYLPREDNISOLONE SODIUM SUCC 40 MG IJ SOLR
40.0000 mg | Freq: Once | INTRAMUSCULAR | Status: DC
Start: 2023-04-14 — End: 2023-04-18

## 2023-04-14 MED ORDER — DIPHENHYDRAMINE HCL 25 MG PO CAPS
25.0000 mg | ORAL_CAPSULE | Freq: Once | ORAL | Status: DC
Start: 2023-04-14 — End: 2023-04-18

## 2023-04-14 MED ORDER — ACETAMINOPHEN 325 MG PO TABS: 650.0000 mg | ORAL_TABLET | Freq: Once | ORAL | Status: DC

## 2023-04-17 ENCOUNTER — Encounter: Payer: Self-pay | Admitting: Gastroenterology

## 2023-04-17 ENCOUNTER — Encounter: Payer: Self-pay | Admitting: Physician Assistant

## 2023-04-18 ENCOUNTER — Ambulatory Visit (INDEPENDENT_AMBULATORY_CARE_PROVIDER_SITE_OTHER): Payer: MEDICAID | Admitting: Primary Care

## 2023-04-18 ENCOUNTER — Encounter (INDEPENDENT_AMBULATORY_CARE_PROVIDER_SITE_OTHER): Payer: Self-pay

## 2023-04-18 ENCOUNTER — Encounter: Payer: Self-pay | Admitting: Gastroenterology

## 2023-04-18 ENCOUNTER — Encounter: Payer: Self-pay | Admitting: Physician Assistant

## 2023-05-01 ENCOUNTER — Other Ambulatory Visit: Payer: Self-pay

## 2023-05-01 ENCOUNTER — Encounter: Payer: Self-pay | Admitting: Gastroenterology

## 2023-05-01 MED ORDER — VITAMIN D (ERGOCALCIFEROL) 1.25 MG (50000 UNIT) PO CAPS: 50000.0000 [IU] | ORAL_CAPSULE | ORAL | 3 refills | Status: DC

## 2023-05-12 ENCOUNTER — Inpatient Hospital Stay: Payer: MEDICAID | Attending: Physician Assistant

## 2023-05-31 ENCOUNTER — Ambulatory Visit: Payer: MEDICAID

## 2023-05-31 MED ORDER — DIPHENHYDRAMINE HCL 25 MG PO CAPS: 25.0000 mg | ORAL_CAPSULE | Freq: Once | ORAL | Status: DC

## 2023-05-31 MED ORDER — METHYLPREDNISOLONE SODIUM SUCC 40 MG IJ SOLR: 40.0000 mg | Freq: Once | INTRAMUSCULAR | Status: DC

## 2023-05-31 MED ORDER — ACETAMINOPHEN 325 MG PO TABS: 650.0000 mg | ORAL_TABLET | Freq: Once | ORAL | Status: DC

## 2023-06-07 ENCOUNTER — Ambulatory Visit: Payer: MEDICAID

## 2023-06-07 VITALS — BP 128/85 | HR 77 | Temp 98.4°F | Resp 18 | Ht 68.0 in | Wt 160.6 lb

## 2023-06-07 DIAGNOSIS — K50919 Crohn's disease, unspecified, with unspecified complications: Secondary | ICD-10-CM

## 2023-06-07 MED ORDER — DIPHENHYDRAMINE HCL 25 MG PO CAPS
25.0000 mg | ORAL_CAPSULE | Freq: Once | ORAL | Status: AC
  Administered 2023-06-07: 25 mg via ORAL
  Filled 2023-06-07: qty 1

## 2023-06-07 MED ORDER — SODIUM CHLORIDE 0.9 % IV SOLN
5.0000 mg/kg | Freq: Once | INTRAVENOUS | Status: AC
  Administered 2023-06-07: 400 mg via INTRAVENOUS
  Filled 2023-06-07: qty 40

## 2023-06-07 MED ORDER — METHYLPREDNISOLONE SODIUM SUCC 40 MG IJ SOLR
40.0000 mg | Freq: Once | INTRAMUSCULAR | Status: AC
  Administered 2023-06-07: 40 mg via INTRAVENOUS
  Filled 2023-06-07: qty 1

## 2023-06-07 MED ORDER — ACETAMINOPHEN 325 MG PO TABS: 650.0000 mg | ORAL_TABLET | Freq: Once | ORAL | Status: DC

## 2023-06-07 NOTE — Progress Notes (Signed)
 Diagnosis: Crohn's Disease  Provider:  Praveen Mannam MD  Procedure: IV Infusion  IV Type: Peripheral, IV Location: R Antecubital  Remicade  (Infliximab ), Dose: 400 mg  Infusion Start Time: 1147  Infusion Stop Time: 1400  Post Infusion IV Care: Peripheral IV Discontinued  Discharge: Condition: Good, Destination: Home . AVS Declined  Performed by:  Sheniah Supak, RN

## 2023-06-14 ENCOUNTER — Ambulatory Visit: Payer: MEDICAID

## 2023-06-26 MED ORDER — PROMETHAZINE HCL 12.5 MG PO TABS: 12.5000 mg | ORAL_TABLET | Freq: Two times a day (BID) | ORAL | 2 refills | Status: DC | PRN

## 2023-06-28 ENCOUNTER — Ambulatory Visit: Payer: MEDICAID

## 2023-06-28 VITALS — BP 136/87 | HR 85 | Temp 98.4°F | Resp 18 | Ht 66.0 in | Wt 158.4 lb

## 2023-06-28 DIAGNOSIS — N920 Excessive and frequent menstruation with regular cycle: Secondary | ICD-10-CM

## 2023-06-28 DIAGNOSIS — K50919 Crohn's disease, unspecified, with unspecified complications: Secondary | ICD-10-CM

## 2023-06-28 DIAGNOSIS — D500 Iron deficiency anemia secondary to blood loss (chronic): Secondary | ICD-10-CM

## 2023-06-28 MED ORDER — ACETAMINOPHEN 325 MG PO TABS: 650.0000 mg | ORAL_TABLET | Freq: Once | ORAL | Status: DC

## 2023-06-28 MED ORDER — DIPHENHYDRAMINE HCL 25 MG PO CAPS: 25.0000 mg | ORAL_CAPSULE | Freq: Once | ORAL | Status: DC

## 2023-06-28 MED ORDER — METHYLPREDNISOLONE SODIUM SUCC 40 MG IJ SOLR: 40.0000 mg | Freq: Once | INTRAMUSCULAR | Status: DC

## 2023-06-28 MED ORDER — IRON SUCROSE 20 MG/ML IV SOLN
200.0000 mg | Freq: Once | INTRAVENOUS | Status: AC
  Administered 2023-06-28: 200 mg via INTRAVENOUS
  Filled 2023-06-28: qty 10

## 2023-06-28 MED ORDER — ONDANSETRON 4 MG PO TBDP
4.0000 mg | ORAL_TABLET | Freq: Once | ORAL | Status: AC
  Administered 2023-06-28: 4 mg via ORAL
  Filled 2023-06-28: qty 1

## 2023-06-28 NOTE — Progress Notes (Signed)
 Diagnosis: Iron  Deficiency Anemia  Provider:  Praveen Mannam MD  Procedure: IV Push  IV Type: Peripheral, IV Location: R Forearm  Venofer  (Iron  Sucrose), Dose: 200 mg  Post Infusion IV Care: Patient declined observation and Peripheral IV Discontinued  Discharge: Condition: Good, Destination: Home . AVS Provided  Performed by:  Kayshaun Polanco, RN

## 2023-06-30 ENCOUNTER — Ambulatory Visit (HOSPITAL_COMMUNITY): Payer: MEDICAID

## 2023-06-30 ENCOUNTER — Ambulatory Visit: Payer: MEDICAID

## 2023-07-01 ENCOUNTER — Ambulatory Visit: Payer: MEDICAID

## 2023-07-05 ENCOUNTER — Other Ambulatory Visit: Payer: Self-pay

## 2023-07-05 ENCOUNTER — Ambulatory Visit
Admission: RE | Admit: 2023-07-05 | Discharge: 2023-07-05 | Disposition: A | Discharge status: Home / Self Care | Payer: MEDICAID | Source: Ambulatory Visit | Attending: Family Medicine | Admitting: Family Medicine

## 2023-07-05 VITALS — BP 160/96 | HR 97 | Temp 98.6°F | Resp 16

## 2023-07-05 DIAGNOSIS — K219 Gastro-esophageal reflux disease without esophagitis: Secondary | ICD-10-CM

## 2023-07-05 DIAGNOSIS — I10 Essential (primary) hypertension: Secondary | ICD-10-CM

## 2023-07-05 DIAGNOSIS — R490 Dysphonia: Secondary | ICD-10-CM | POA: Diagnosis not present

## 2023-07-05 MED ORDER — LOSARTAN POTASSIUM 25 MG PO TABS: 25.0000 mg | ORAL_TABLET | Freq: Every day | ORAL | 2 refills | Status: DC

## 2023-07-05 MED ORDER — PANTOPRAZOLE SODIUM 40 MG PO TBEC: 40.0000 mg | DELAYED_RELEASE_TABLET | Freq: Every day | ORAL | 2 refills | Status: DC

## 2023-07-05 NOTE — ED Provider Notes (Signed)
 [facility]   [id number] 07/05/23 Arrival Time: 7829  ASSESSMENT & PLAN:  1. Elevated blood pressure reading with diagnosis of hypertension   2. Hoarseness of voice   3. Gastroesophageal reflux disease without esophagitis    Refilled BP med at pt request. Ques if hoarseness related to silent reflux; discussed. Protonix  daily.  Meds ordered this encounter  Medications   losartan  (COZAAR ) 25 MG tablet    Sig: Take 1 tablet (25 mg total) by mouth daily.    Dispense:  30 tablet    Refill:  2   pantoprazole  (PROTONIX ) 40 MG tablet    Sig: Take 1 tablet (40 mg total) by mouth daily.    Dispense:  30 tablet    Refill:  2    **Patient requests 90 days supply**   Reports she has f/u 07/2023 with new PCP. May discuss ENT referral if voice has not improved. Is a smoker.   Reviewed expectations re: course of current medical issues. Questions answered. Outlined signs and symptoms indicating need for more acute intervention. Patient verbalized understanding. After Visit Summary given.   SUBJECTIVE: History from: patient.  Nohelani E Conrad is a 48 y.o. female who presents with hoarseness of voice; over past week or two has noted. Denies recent illness. Does smoke black and milds in addition to [facility]. Denies n/v. Does have h/o GERD; no current tx. Normal appetite and PO intake. Voice seems worse in the mornings. Weight stable. Increased blood pressure noted today. Reports that she is treated for HTN. Needs refill of BP meds. Has noted elevated BP readings.  Social History   Tobacco Use  Smoking Status Some Days   Types: Cigars  Smokeless Tobacco Not on file    OBJECTIVE:  Vitals:   07/05/23 1005  BP: (!) 160/96  Pulse: 97  Resp: 16  Temp: 98.6 F (37 C)  TempSrc: Oral  SpO2: 98%    General appearance: alert; no distress HEENT: nasal congestion; clear runny nose; throat appears normal; tongue appears normal Neck: supple without LAD; trachea midline Lungs:  unlabored respirations, symmetrical air entry; clear bilat Skin: warm and dry Psychological: alert and cooperative; normal mood and affect  Allergies  Allergen Reactions   Morphine And Codeine Itching    Past Medical History:  Diagnosis Date   Anemia 06/04/2021   Hypertension    Family History  Problem Relation Age of Onset   Diabetes Mother    Breast cancer Mother    Colon cancer Neg Hx    Stomach cancer Neg Hx    Social History   Socioeconomic History   Marital status: Single    Spouse name: Not on file   Number of children: Not on file   Years of education: Not on file   Highest education level: 11th grade  Occupational History   Not on file  Tobacco Use   Smoking status: Some Days    Types: Cigars   Smokeless tobacco: Not on file  Vaping Use   Vaping status: Never Used  Substance and Sexual Activity   Alcohol use: No   Drug use: Yes    Frequency: 6.0 times per week    Types: Marijuana    Comment: every other day   Sexual activity: Yes    Birth control/protection: None  Other Topics Concern   Not on file  Social History Narrative   Not on file   Social Drivers of Health   Financial Resource Strain: Medium Risk (04/14/2023)   Overall  Financial Resource Strain (CARDIA)    Difficulty of Paying Living Expenses: Somewhat hard  Food Insecurity: Food Insecurity Present (04/14/2023)   Hunger Vital Sign    Worried About Running Out of Food in the Last Year: Often true    Ran Out of Food in the Last Year: Sometimes true  Transportation Needs: Unmet Transportation Needs (04/14/2023)   PRAPARE - Administrator, Civil Service (Medical): Yes    Lack of Transportation (Non-Medical): Yes  Physical Activity: Unknown (04/14/2023)   Exercise Vital Sign    Days of Exercise per Week: 3 days    Minutes of Exercise per Session: Not on file  Stress: No Stress Concern Present (04/14/2023)   [employer] of Occupational Health - Occupational Stress Questionnaire     Feeling of Stress : Only a little  Social Connections: Moderately Isolated (04/14/2023)   Social Connection and Isolation Panel [NHANES]    Frequency of Communication with Friends and Family: More than three times a week    Frequency of Social Gatherings with Friends and Family: More than three times a week    Attends Religious Services: 1 to 4 times per year    Active Member of [employer] or Organizations: No    Attends Engineer, structural: Not on file    Marital Status: Never married  Intimate Partner Violence: Unknown (04/30/2021)   Received from [employer], [facility]   HITS    Physically Hurt: Not on file    Insult or Talk Down To: Not on file    Threaten Physical Harm: Not on file    Scream or Curse: Not on file             Afton Albright, MD 07/05/23 1026

## 2023-07-05 NOTE — ED Triage Notes (Signed)
 Pt st's she has been hoarse x's 1 week.  Denies any sore throat or cough.  Pt also requesting Rx for her blood pressure meds.  St's she has been out x's 2 weeks

## 2023-07-05 NOTE — ED Triage Notes (Addendum)
 error

## 2023-07-17 ENCOUNTER — Ambulatory Visit (INDEPENDENT_AMBULATORY_CARE_PROVIDER_SITE_OTHER): Payer: MEDICAID | Admitting: Primary Care

## 2023-08-02 ENCOUNTER — Ambulatory Visit: Payer: MEDICAID

## 2023-08-02 VITALS — BP 144/78 | HR 85 | Temp 97.6°F | Resp 16 | Ht 66.0 in | Wt 160.0 lb

## 2023-08-02 DIAGNOSIS — K50919 Crohn's disease, unspecified, with unspecified complications: Secondary | ICD-10-CM

## 2023-08-02 MED ORDER — METHYLPREDNISOLONE SODIUM SUCC 40 MG IJ SOLR
40.0000 mg | Freq: Once | INTRAMUSCULAR | Status: AC
  Administered 2023-08-02: 40 mg via INTRAVENOUS
  Filled 2023-08-02: qty 1

## 2023-08-02 MED ORDER — ACETAMINOPHEN 325 MG PO TABS
650.0000 mg | ORAL_TABLET | Freq: Once | ORAL | Status: DC
Start: 2023-08-02 — End: 2023-08-02
  Filled 2023-08-02: qty 2

## 2023-08-02 MED ORDER — DIPHENHYDRAMINE HCL 25 MG PO CAPS
25.0000 mg | ORAL_CAPSULE | Freq: Once | ORAL | Status: AC
  Administered 2023-08-02: 25 mg via ORAL
  Filled 2023-08-02: qty 1

## 2023-08-02 MED ORDER — SODIUM CHLORIDE 0.9 % IV SOLN
5.0000 mg/kg | Freq: Once | INTRAVENOUS | Status: AC
  Administered 2023-08-02: 400 mg via INTRAVENOUS
  Filled 2023-08-02: qty 40

## 2023-08-02 NOTE — Progress Notes (Signed)
 Diagnosis: Crohn's Disease  Provider:  Praveen Mannam MD  Procedure: IV Infusion  IV Type: Peripheral, IV Location: R Antecubital  Remicade  (Infliximab ), Dose: 400 mg  Infusion Start Time: 0921  Infusion Stop Time: 1135  Post Infusion IV Care: Peripheral IV Discontinued  Discharge: Condition: Good, Destination: Home . AVS Provided  Performed by:  Leita FORBES Miles, LPN

## 2023-08-11 ENCOUNTER — Ambulatory Visit: Payer: MEDICAID | Admitting: Physician Assistant

## 2023-08-11 ENCOUNTER — Telehealth: Payer: Self-pay | Admitting: Physician Assistant

## 2023-08-11 ENCOUNTER — Other Ambulatory Visit: Payer: MEDICAID

## 2023-08-15 ENCOUNTER — Other Ambulatory Visit: Payer: Self-pay | Admitting: Gastroenterology

## 2023-08-31 ENCOUNTER — Other Ambulatory Visit: Payer: Self-pay | Admitting: Physician Assistant

## 2023-08-31 DIAGNOSIS — D508 Other iron deficiency anemias: Secondary | ICD-10-CM

## 2023-09-01 ENCOUNTER — Inpatient Hospital Stay: Payer: MEDICAID | Attending: Physician Assistant

## 2023-09-01 ENCOUNTER — Inpatient Hospital Stay: Payer: MEDICAID | Admitting: Physician Assistant

## 2023-09-01 ENCOUNTER — Ambulatory Visit: Payer: Self-pay | Admitting: Physician Assistant

## 2023-09-01 VITALS — BP 151/97 | HR 93 | Temp 97.7°F | Resp 16 | Wt 158.6 lb

## 2023-09-01 DIAGNOSIS — K909 Intestinal malabsorption, unspecified: Secondary | ICD-10-CM | POA: Diagnosis not present

## 2023-09-01 DIAGNOSIS — Z79899 Other long term (current) drug therapy: Secondary | ICD-10-CM | POA: Insufficient documentation

## 2023-09-01 DIAGNOSIS — N920 Excessive and frequent menstruation with regular cycle: Secondary | ICD-10-CM | POA: Diagnosis not present

## 2023-09-01 DIAGNOSIS — F17290 Nicotine dependence, other tobacco product, uncomplicated: Secondary | ICD-10-CM | POA: Insufficient documentation

## 2023-09-01 DIAGNOSIS — Z803 Family history of malignant neoplasm of breast: Secondary | ICD-10-CM | POA: Insufficient documentation

## 2023-09-01 DIAGNOSIS — D500 Iron deficiency anemia secondary to blood loss (chronic): Secondary | ICD-10-CM | POA: Diagnosis present

## 2023-09-01 DIAGNOSIS — K5090 Crohn's disease, unspecified, without complications: Secondary | ICD-10-CM | POA: Diagnosis not present

## 2023-09-01 DIAGNOSIS — Z5982 Transportation insecurity: Secondary | ICD-10-CM | POA: Insufficient documentation

## 2023-09-01 DIAGNOSIS — D508 Other iron deficiency anemias: Secondary | ICD-10-CM

## 2023-09-01 DIAGNOSIS — K50919 Crohn's disease, unspecified, with unspecified complications: Secondary | ICD-10-CM

## 2023-09-01 LAB — CMP (CANCER CENTER ONLY)
ALT: 17 U/L (ref 0–44)
AST: 17 U/L (ref 15–41)
Albumin: 4.1 g/dL (ref 3.5–5.0)
Alkaline Phosphatase: 64 U/L (ref 38–126)
Anion gap: 4 — ABNORMAL LOW (ref 5–15)
BUN: 12 mg/dL (ref 6–20)
CO2: 28 mmol/L (ref 22–32)
Calcium: 8.9 mg/dL (ref 8.9–10.3)
Chloride: 110 mmol/L (ref 98–111)
Creatinine: 0.78 mg/dL (ref 0.44–1.00)
GFR, Estimated: >60 mL/min (ref >60)
Glucose, Bld: 78 mg/dL (ref 70–99)
Potassium: 3.5 mmol/L (ref 3.5–5.1)
Sodium: 142 mmol/L (ref 135–145)
Total Bilirubin: 0.5 mg/dL (ref 0.0–1.2)
Total Protein: 7.1 g/dL (ref 6.5–8.1)

## 2023-09-01 LAB — CBC WITH DIFFERENTIAL (CANCER CENTER ONLY)
Abs Immature Granulocytes: 0.01 K/uL (ref 0.00–0.07)
Basophils Absolute: 0.1 K/uL (ref 0.0–0.1)
Basophils Relative: 1 %
Eosinophils Absolute: 0.1 K/uL (ref 0.0–0.5)
Eosinophils Relative: 2 %
HCT: 32.4 % — ABNORMAL LOW (ref 36.0–46.0)
Hemoglobin: 9.6 g/dL — ABNORMAL LOW (ref 12.0–15.0)
Immature Granulocytes: 0 %
Lymphocytes Relative: 25 %
Lymphs Abs: 1.7 K/uL (ref 0.7–4.0)
MCH: 23.4 pg — ABNORMAL LOW (ref 26.0–34.0)
MCHC: 29.6 g/dL — ABNORMAL LOW (ref 30.0–36.0)
MCV: 78.8 fL — ABNORMAL LOW (ref 80.0–100.0)
Monocytes Absolute: 0.5 K/uL (ref 0.1–1.0)
Monocytes Relative: 7 %
Neutro Abs: 4.5 K/uL (ref 1.7–7.7)
Neutrophils Relative %: 65 %
Platelet Count: 384 K/uL (ref 150–400)
RBC: 4.11 MIL/uL (ref 3.87–5.11)
RDW: 17.5 % — ABNORMAL HIGH (ref 11.5–15.5)
WBC Count: 6.9 K/uL (ref 4.0–10.5)
nRBC: 0 % (ref 0.0–0.2)

## 2023-09-01 LAB — IRON AND IRON BINDING CAPACITY (CC-WL,HP ONLY)
Iron: 17 ug/dL — ABNORMAL LOW (ref 28–170)
Saturation Ratios: 3 % — ABNORMAL LOW (ref 10.4–31.8)
TIBC: 578 ug/dL — ABNORMAL HIGH (ref 250–450)
UIBC: 561 ug/dL — ABNORMAL HIGH (ref 148–442)

## 2023-09-01 LAB — FERRITIN: Ferritin: 6 ng/mL — ABNORMAL LOW (ref 11–307)

## 2023-09-01 NOTE — Progress Notes (Signed)
 [facility] [phone]   [phone]  PROGRESS NOTE  Patient Care Team: Patient, No Pcp Per as PCP - General (General Practice) Dawn Walsh Dawn Walsh as Physician Assistant (Hematology and Oncology)  CHIEF COMPLAINT: Iron  deficiency anemia.   TREATMENT HISTORY:  09/13/2022-12/30/2022: IV venofer  200 mg x 5 doses   03/01/2023-06/28/2023: IV venofer  200 mg x 3 doses.  HISTORY OF PRESENTING ILLNESS:  Dawn Walsh 47 y.o. female returns for a follow up for iron  deficiency anemia. She was last seen on 02/14/2023.  In the interim, she received IV iron  x 3 doses.  On exam today, Dawn Walsh reports she has noticed progressive fatigue since last visit.  She is starting to crave ice and starch again and has more cold sensitivity.  She continues to have monthly menstrual cycles lasting 5 days with 3 days of heavy bleeding along with Crohn's that is managed under her gastroenterologist.  She does receive infliximab  infusions to help manage her Crohn's disease.  She denies any other signs of bleeding including hematochezia or melena.  Patient denies fevers, chills, night sweats, shortness of breath, chest pain, cough, headaches, dizziness or neuropathy. She has no other complaints.  Rest of the 10 point ROS is below.   MEDICAL HISTORY:  Past Medical History:  Diagnosis Date   Anemia 06/04/2021   Hypertension     SURGICAL HISTORY: Past Surgical History:  Procedure Laterality Date   BIOPSY  05/24/2022   Procedure: BIOPSY;  Surgeon: San Sandor GAILS, DO;  Location: [facility] ENDOSCOPY;  Service: Gastroenterology;;   COLONOSCOPY N/A 05/24/2022   Procedure: COLONOSCOPY;  Surgeon: San Sandor GAILS, DO;  Location: [facility] ENDOSCOPY;  Service: Gastroenterology;  Laterality: N/A;   ESOPHAGOGASTRODUODENOSCOPY N/A 05/24/2022   Procedure: ESOPHAGOGASTRODUODENOSCOPY (EGD);  Surgeon: San Sandor GAILS, DO;  Location: [facility] ENDOSCOPY;  Service: Gastroenterology;  Laterality: N/A;    TUBAL LIGATION  04/09/2000    SOCIAL HISTORY: Social History   Socioeconomic History   Marital status: Single    Spouse name: Not on file   Number of children: Not on file   Years of education: Not on file   Highest education level: 11th grade  Occupational History   Not on file  Tobacco Use   Smoking status: Some Days    Types: Cigars   Smokeless tobacco: Not on file  Vaping Use   Vaping status: Never Used  Substance and Sexual Activity   Alcohol use: No   Drug use: Yes    Frequency: 6.0 times per week    Types: Marijuana    Comment: every other day   Sexual activity: Yes    Birth control/protection: None  Other Topics Concern   Not on file  Social History Narrative   Not on file   Social Drivers of Health   Financial Resource Strain: Medium Risk (04/14/2023)   Overall Financial Resource Strain (CARDIA)    Difficulty of Paying Living Expenses: Somewhat hard  Food Insecurity: Food Insecurity Present (04/14/2023)   Hunger Vital Sign    Worried About Running Out of Food in the Last Year: Often true    Ran Out of Food in the Last Year: Sometimes true  Transportation Needs: Unmet Transportation Needs (04/14/2023)   PRAPARE - Transportation    Lack of Transportation (Medical): Yes    Lack of Transportation (Non-Medical): Yes  Physical Activity: Unknown (04/14/2023)   Exercise Vital Sign    Days of Exercise per Week: 3 days    Minutes of Exercise  per Session: Not on file  Stress: No Stress Concern Present (04/14/2023)   [employer] of Occupational Health - Occupational Stress Questionnaire    Feeling of Stress : Only a little  Social Connections: Moderately Isolated (04/14/2023)   Social Connection and Isolation Panel    Frequency of Communication with Friends and Family: More than three times a week    Frequency of Social Gatherings with Friends and Family: More than three times a week    Attends Religious Services: 1 to 4 times per year    Active Member of [employer]  or Organizations: No    Attends Engineer, structural: Not on file    Marital Status: Never married  Intimate Partner Violence: Unknown (04/30/2021)   Received from [facility]   HITS    Physically Hurt: Not on file    Insult or Talk Down To: Not on file    Threaten Physical Harm: Not on file    Scream or Curse: Not on file    FAMILY HISTORY: Family History  Problem Relation Age of Onset   Diabetes Mother    Breast cancer Mother    Colon cancer Neg Hx    Stomach cancer Neg Hx     ALLERGIES:  is allergic to morphine and codeine.  MEDICATIONS:  Current Outpatient Medications  Medication Sig Dispense Refill   cyanocobalamin  (VITAMIN B12) 250 MCG tablet Take 1 tablet (250 mcg total) by mouth daily. (Patient taking differently: Take 250 mcg by mouth daily. Gummies)     inFLIXimab  300 mg in sodium chloride  0.9 % 220 mL Inject 300 mg into the vein every 8 (eight) weeks.     losartan  (COZAAR ) 25 MG tablet Take 1 tablet (25 mg total) by mouth daily. 30 tablet 2   ondansetron  (ZOFRAN ) 4 MG tablet Take 1 tablet (4 mg total) by mouth every 8 (eight) hours as needed for nausea or vomiting. 30 tablet 2   pantoprazole  (PROTONIX ) 40 MG tablet Take 1 tablet (40 mg total) by mouth daily. 30 tablet 2   promethazine  (PHENERGAN ) 12.5 MG tablet Take 1 tablet (12.5 mg total) by mouth every 12 (twelve) hours as needed for nausea or vomiting. 30 tablet 2   Vitamin D , Ergocalciferol , (DRISDOL ) 1.25 MG (50000 UNIT) CAPS capsule TAKE 1 CAPSULE BY MOUTH EVERY 7 DAYS 5 capsule 3   No current facility-administered medications for this visit.   Facility-Administered Medications Ordered in Other Visits  Medication Dose Route Frequency Provider Last Rate Last Admin   acetaminophen  (TYLENOL ) tablet 650 mg  650 mg Oral Once James, Teldrin D, RPH       diphenhydrAMINE  (BENADRYL ) capsule 25 mg  25 mg Oral Once James, Teldrin D, RPH       methylPREDNISolone  sodium succinate (SOLU-MEDROL ) 40 mg/mL injection  40 mg  40 mg Intravenous Once James, Teldrin D, [facility]        REVIEW OF SYSTEMS:   Constitutional: ( - ) fevers, ( - )  chills , ( - ) night sweats Eyes: ( - ) blurriness of vision, ( - ) double vision, ( - ) watery eyes Ears, nose, mouth, throat, and face: ( - ) mucositis, ( - ) sore throat Respiratory: ( - ) cough, (+ ) dyspnea, ( - ) wheezes Cardiovascular: ( - ) palpitation, ( - ) chest discomfort, ( - ) lower extremity swelling Gastrointestinal:  (+ ) nausea, ( - ) heartburn, ( - ) change in bowel habits Skin: ( - ) abnormal skin  rashes Lymphatics: ( - ) new lymphadenopathy, ( - ) easy bruising Neurological: ( - ) numbness, ( - ) tingling, ( - ) new weaknesses Behavioral/Psych: ( - ) mood change, ( - ) new changes  All other systems were reviewed with the patient and are negative.  PHYSICAL EXAMINATION: ECOG PERFORMANCE STATUS: 1 - Symptomatic but completely ambulatory  Vitals:   09/01/23 0924  BP: (!) 151/97  Pulse: 93  Resp: 16  Temp: 97.7 F (36.5 C)  SpO2: 100%    Filed Weights   09/01/23 0924  Weight: 158 lb 9.6 oz (71.9 kg)     GENERAL: well appearing female in NAD  SKIN: skin color, texture, turgor are normal, no rashes or significant lesions EYES: conjunctiva are pink and non-injected, sclera clear LUNGS: clear to auscultation and percussion with normal breathing effort HEART: regular rate & rhythm and no murmurs and no lower extremity edema Musculoskeletal: no cyanosis of digits and no clubbing  PSYCH: alert & oriented x 3, fluent speech NEURO: no focal motor/sensory deficits  LABORATORY DATA:  I have reviewed the data as listed    Latest Ref Rng & Units 09/01/2023    8:54 AM 03/14/2023   11:27 AM 02/14/2023    9:25 AM  CBC  WBC 4.0 - 10.5 K/uL 6.9  5.5  5.8   Hemoglobin 12.0 - 15.0 g/dL 9.6  88.1  88.8   Hematocrit 36.0 - 46.0 % 32.4  36.7  35.6   Platelets 150 - 400 K/uL 384  267.0  273        Latest Ref Rng & Units 04/06/2023   12:52 PM 03/14/2023    11:27 AM 10/28/2022   10:10 AM  CMP  Glucose 70 - 99 mg/dL 77  78  80   BUN 6 - 24 mg/dL 9  9  11    Creatinine 0.57 - 1.00 mg/dL 9.32  9.31  9.06   Sodium 134 - 144 mmol/L 141  142  133   Potassium 3.5 - 5.2 mmol/L 4.0  3.5  3.8   Chloride 96 - 106 mmol/L 106  108  100   CO2 20 - 29 mmol/L 22  28  22    Calcium 8.7 - 10.2 mg/dL 9.1  8.6  8.8   Total Protein 6.0 - 8.3 g/dL  6.5  6.7   Total Bilirubin 0.2 - 1.2 mg/dL  0.5  1.0   Alkaline Phos 39 - 117 U/L  57  59   AST 0 - 37 U/L  15  23   ALT 0 - 35 U/L  11  25     RADIOGRAPHIC STUDIES: I have personally reviewed the radiological images as listed and agreed with the findings in the report. No results found.  ASSESSMENT & PLAN Dawn Walsh is a 48 y.o. female who presents to the hematology clinic for iron  deficiency anemia.   # Iron  Deficiency Anemia 2/2 to GYN Bleeding and Crohn's disease -- Findings are consistent with iron  deficiency anemia secondary to patient's menorrhagia and malabsorption from Crohn's disease.  --Encouraged her to follow-up with OB/GYN for better control of her menstrual cycles. Referral sent to gynecology. --Unable to tolerate PO iron  due to GI discomfort.  --Last received IV venofer  200 mg x 3 doses from 03/01/2023-06/28/2023.  --Labs today show anemia with hemoglobin of 9.6, MCV 78.8.  Iron  panel is pending. --Tentative plan to proceed with IV iron  to bolster iron  levels. --Plan for return to clinic in 3 months for  lab check in 6 months for labs/follow-up.  No orders of the defined types were placed in this encounter.   All questions were answered. The patient knows to call the clinic with any problems, questions or concerns.  I have spent a total of 25 minutes minutes of face-to-face and non-face-to-face time, preparing to see the patient, obtaining and/or reviewing separately obtained history, performing a medically appropriate examination, counseling and educating the patient, ordering  medications/tests/procedures, documenting clinical information in the electronic health record,and care coordination.   Walsh Police, PA-C Department of Hematology/Oncology [facility] Phone: [phone]

## 2023-09-14 ENCOUNTER — Telehealth: Payer: Self-pay | Admitting: Pharmacy Technician

## 2023-09-14 ENCOUNTER — Other Ambulatory Visit: Payer: Self-pay | Admitting: Physician Assistant

## 2023-09-14 NOTE — Telephone Encounter (Signed)
 Auth Submission: NO AUTH NEEDED Site of care: Site of care: [facility] Payer: [employer] Medication & CPT/J Code(s) submitted: Venofer  (Iron  Sucrose) J1756 Diagnosis Code:  Route of submission (phone, fax, portal):  Phone # Fax # Auth type: Buy/Bill PB Units/visits requested: x5 doses Reference number:  Approval from: 09/14/23 to 12/15/23

## 2023-09-14 NOTE — Telephone Encounter (Signed)
 error

## 2023-09-27 ENCOUNTER — Ambulatory Visit: Payer: MEDICAID

## 2023-09-29 ENCOUNTER — Ambulatory Visit: Payer: MEDICAID

## 2023-09-29 VITALS — BP 135/85 | HR 97 | Temp 98.3°F | Resp 18 | Ht 67.0 in | Wt 159.6 lb

## 2023-09-29 DIAGNOSIS — D500 Iron deficiency anemia secondary to blood loss (chronic): Secondary | ICD-10-CM

## 2023-09-29 DIAGNOSIS — D509 Iron deficiency anemia, unspecified: Secondary | ICD-10-CM | POA: Diagnosis not present

## 2023-09-29 DIAGNOSIS — N920 Excessive and frequent menstruation with regular cycle: Secondary | ICD-10-CM | POA: Diagnosis not present

## 2023-09-29 MED ORDER — IRON SUCROSE 200 MG IVPB - SIMPLE MED
200.0000 mg | Freq: Once | Status: DC
Start: 2023-09-29 — End: 2023-09-29

## 2023-09-29 MED ORDER — IRON SUCROSE 20 MG/ML IV SOLN: 100.0000 mg | Freq: Once | INTRAVENOUS | Status: DC

## 2023-09-29 MED ORDER — IRON SUCROSE 20 MG/ML IV SOLN
200.0000 mg | Freq: Once | INTRAVENOUS | Status: AC
  Administered 2023-09-29: 200 mg via INTRAVENOUS
  Filled 2023-09-29: qty 10

## 2023-09-29 MED ORDER — SODIUM CHLORIDE 0.9 % IV BOLUS
250.0000 mL | Freq: Once | INTRAVENOUS | Status: AC
  Administered 2023-09-29: 250 mL via INTRAVENOUS
  Filled 2023-09-29: qty 250

## 2023-09-29 NOTE — Progress Notes (Signed)
 Diagnosis: Iron  Deficiency Anemia  Provider:  Praveen Mannam MD  Procedure: IV Push  IV Type: Peripheral, IV Location: R Antecubital  Venofer  (Iron  Sucrose), Dose: 200 mg  Post Infusion IV Care: Patient declined observation and Peripheral IV Discontinued  Discharge: Condition: Good, Destination: Home . AVS Provided  Performed by:  Haru Shaff, RN

## 2023-10-04 ENCOUNTER — Ambulatory Visit: Payer: MEDICAID

## 2023-10-04 MED ORDER — ACETAMINOPHEN 325 MG PO TABS: 650.0000 mg | ORAL_TABLET | Freq: Once | ORAL | Status: DC

## 2023-10-04 MED ORDER — DIPHENHYDRAMINE HCL 25 MG PO CAPS: 25.0000 mg | ORAL_CAPSULE | Freq: Once | ORAL | Status: DC

## 2023-10-04 MED ORDER — METHYLPREDNISOLONE SODIUM SUCC 40 MG IJ SOLR: 40.0000 mg | Freq: Once | INTRAMUSCULAR | Status: DC

## 2023-10-18 ENCOUNTER — Encounter: Payer: Self-pay | Admitting: Gastroenterology

## 2023-10-18 ENCOUNTER — Ambulatory Visit: Payer: MEDICAID

## 2023-10-18 DIAGNOSIS — K219 Gastro-esophageal reflux disease without esophagitis: Secondary | ICD-10-CM

## 2023-10-18 MED ORDER — PANTOPRAZOLE SODIUM 40 MG PO TBEC: 40.0000 mg | DELAYED_RELEASE_TABLET | Freq: Every day | ORAL | 2 refills | Status: AC

## 2023-10-18 MED ORDER — PROMETHAZINE HCL 12.5 MG PO TABS: 12.5000 mg | ORAL_TABLET | Freq: Two times a day (BID) | ORAL | 2 refills | Status: AC | PRN

## 2023-10-19 ENCOUNTER — Ambulatory Visit: Payer: MEDICAID

## 2023-10-19 ENCOUNTER — Encounter: Payer: Self-pay | Admitting: Physician Assistant

## 2023-10-19 VITALS — BP 135/86 | HR 79 | Temp 98.2°F | Resp 20 | Ht 67.0 in | Wt 156.0 lb

## 2023-10-19 DIAGNOSIS — D500 Iron deficiency anemia secondary to blood loss (chronic): Secondary | ICD-10-CM

## 2023-10-19 DIAGNOSIS — N920 Excessive and frequent menstruation with regular cycle: Secondary | ICD-10-CM | POA: Diagnosis not present

## 2023-10-19 DIAGNOSIS — K50919 Crohn's disease, unspecified, with unspecified complications: Secondary | ICD-10-CM

## 2023-10-19 MED ORDER — ACETAMINOPHEN 325 MG PO TABS: 650.0000 mg | ORAL_TABLET | Freq: Once | ORAL | Status: AC

## 2023-10-19 MED ORDER — SODIUM CHLORIDE 0.9 % IV SOLN
5.0000 mg/kg | Freq: Once | INTRAVENOUS | Status: AC
  Administered 2023-10-19: 400 mg via INTRAVENOUS
  Filled 2023-10-19: qty 40

## 2023-10-19 MED ORDER — METHYLPREDNISOLONE SODIUM SUCC 40 MG IJ SOLR
40.0000 mg | Freq: Once | INTRAMUSCULAR | Status: AC
  Administered 2023-10-19: 40 mg via INTRAVENOUS
  Filled 2023-10-19: qty 1

## 2023-10-19 MED ORDER — IRON SUCROSE 200 MG IVPB - SIMPLE MED: 200.0000 mg | Freq: Once | Status: DC

## 2023-10-19 MED ORDER — DIPHENHYDRAMINE HCL 25 MG PO CAPS
25.0000 mg | ORAL_CAPSULE | Freq: Once | ORAL | Status: AC
  Administered 2023-10-19: 25 mg via ORAL
  Filled 2023-10-19: qty 1

## 2023-10-19 MED ORDER — SODIUM CHLORIDE 0.9 % IV SOLN
200.0000 mg | Freq: Once | INTRAVENOUS | Status: DC
  Filled 2023-10-19: qty 10

## 2023-10-19 MED ORDER — METHYLPREDNISOLONE SODIUM SUCC 40 MG IJ SOLR: 40.0000 mg | Freq: Once | INTRAMUSCULAR | Status: AC

## 2023-10-19 MED ORDER — DIPHENHYDRAMINE HCL 25 MG PO CAPS: 25.0000 mg | ORAL_CAPSULE | Freq: Once | ORAL | Status: AC

## 2023-10-19 MED ORDER — ACETAMINOPHEN 325 MG PO TABS
650.0000 mg | ORAL_TABLET | Freq: Once | ORAL | Status: AC
  Administered 2023-10-19: 650 mg via ORAL
  Filled 2023-10-19: qty 2

## 2023-10-19 NOTE — Progress Notes (Signed)
 Diagnosis: Crohn's Disease  Provider:  Praveen Mannam MD  Procedure: IV Infusion  IV Type: Peripheral, IV Location: L Forearm  Remicade  (Infliximab ), Dose: 400mg   Infusion Start Time: 0945  Infusion Stop Time: 1216  Post Infusion IV Care: Peripheral IV Discontinued  Discharge: Condition: Good, Destination: Home . AVS Provided  Performed by:  Shonteria Abeln, RN

## 2023-10-20 ENCOUNTER — Other Ambulatory Visit: Payer: Self-pay | Admitting: Nurse Practitioner

## 2023-10-26 ENCOUNTER — Ambulatory Visit: Payer: MEDICAID

## 2023-10-26 MED ORDER — SODIUM CHLORIDE 0.9 % IV SOLN
200.0000 mg | Freq: Once | INTRAVENOUS | Status: DC
  Filled 2023-10-26: qty 10

## 2023-11-01 ENCOUNTER — Ambulatory Visit: Payer: MEDICAID

## 2023-11-01 VITALS — BP 151/99 | HR 86 | Temp 98.0°F | Resp 18 | Ht 67.0 in | Wt 157.2 lb

## 2023-11-01 DIAGNOSIS — N920 Excessive and frequent menstruation with regular cycle: Secondary | ICD-10-CM

## 2023-11-01 DIAGNOSIS — K50919 Crohn's disease, unspecified, with unspecified complications: Secondary | ICD-10-CM | POA: Diagnosis not present

## 2023-11-01 DIAGNOSIS — D500 Iron deficiency anemia secondary to blood loss (chronic): Secondary | ICD-10-CM | POA: Diagnosis not present

## 2023-11-01 MED ORDER — SODIUM CHLORIDE 0.9 % IV SOLN
200.0000 mg | Freq: Once | INTRAVENOUS | Status: AC
  Administered 2023-11-01: 200 mg via INTRAVENOUS
  Filled 2023-11-01: qty 10

## 2023-11-01 MED ORDER — SODIUM CHLORIDE 0.9 % IV BOLUS
250.0000 mL | Freq: Once | INTRAVENOUS | Status: AC
  Filled 2023-11-01: qty 250

## 2023-11-01 NOTE — Progress Notes (Signed)
 Diagnosis: Iron  Deficiency Anemia  Provider:  Praveen Mannam MD  Procedure: IV Infusion  IV Type: Peripheral, IV Location: R Antecubital  Venofer  (Iron  Sucrose), Dose: 200 mg  Infusion Start Time: 9161  Infusion Stop Time: 0900  Post Infusion IV Care: Patient declined observation and Peripheral IV Discontinued  Discharge: Condition: Good, Destination: Home . AVS Provided  Performed by:  Mikey Maffett, RN

## 2023-11-08 ENCOUNTER — Ambulatory Visit: Payer: MEDICAID

## 2023-11-08 MED ORDER — IRON SUCROSE 200 MG IVPB - SIMPLE MED: 200.0000 mg | Freq: Once | Status: DC

## 2023-11-08 MED ORDER — SODIUM CHLORIDE 0.9 % IV SOLN
200.0000 mg | Freq: Once | INTRAVENOUS | Status: DC
  Filled 2023-11-08: qty 10

## 2023-11-14 ENCOUNTER — Ambulatory Visit: Payer: MEDICAID

## 2023-11-14 VITALS — BP 136/84 | HR 93 | Temp 97.9°F | Resp 16 | Ht 67.0 in | Wt 159.8 lb

## 2023-11-14 DIAGNOSIS — D500 Iron deficiency anemia secondary to blood loss (chronic): Secondary | ICD-10-CM | POA: Diagnosis not present

## 2023-11-14 MED ORDER — SODIUM CHLORIDE 0.9 % IV SOLN
200.0000 mg | Freq: Once | INTRAVENOUS | Status: AC
  Administered 2023-11-14: 200 mg via INTRAVENOUS
  Filled 2023-11-14: qty 10

## 2023-11-14 MED ORDER — IRON SUCROSE 20 MG/ML IV SOLN: 200.0000 mg | Freq: Once | INTRAVENOUS | Status: DC

## 2023-11-14 NOTE — Progress Notes (Signed)
 Diagnosis: Iron  Deficiency Anemia  Provider:  Praveen Mannam MD  Procedure: IV Infusion  IV Type: Peripheral, IV Location: R Antecubital  Venofer  (Iron  Sucrose), Dose: 200 mg  Infusion Start Time: 0948  Infusion Stop Time: 1004  Post Infusion IV Care: Patient declined observation and Peripheral IV Discontinued  Discharge: Condition: Good, Destination: Home . AVS Provided  Performed by:  Kenyona Rena, RN

## 2023-11-22 ENCOUNTER — Ambulatory Visit: Payer: MEDICAID

## 2023-11-22 VITALS — BP 160/91 | HR 88 | Temp 98.4°F | Resp 16 | Ht 67.0 in | Wt 158.4 lb

## 2023-11-22 DIAGNOSIS — D500 Iron deficiency anemia secondary to blood loss (chronic): Secondary | ICD-10-CM

## 2023-11-22 MED ORDER — SODIUM CHLORIDE 0.9 % IV SOLN
200.0000 mg | Freq: Once | INTRAVENOUS | Status: AC
  Administered 2023-11-22: 200 mg via INTRAVENOUS
  Filled 2023-11-22: qty 10

## 2023-11-22 NOTE — Progress Notes (Signed)
 Diagnosis: Iron  Deficiency Anemia  Provider:  Praveen Mannam MD  Procedure: IV Infusion  IV Type: Peripheral, IV Location: R Antecubital  Venofer  (Iron  Sucrose), Dose: 200 mg  Infusion Start Time: 0922  Infusion Stop Time: 0938  Post Infusion IV Care: Patient declined observation and Peripheral IV Discontinued  Discharge: Condition: Good, Destination: Home . AVS Declined  Performed by:  Karlynn Furrow, RN

## 2023-12-01 ENCOUNTER — Inpatient Hospital Stay: Payer: MEDICAID | Attending: Physician Assistant

## 2023-12-06 ENCOUNTER — Ambulatory Visit (INDEPENDENT_AMBULATORY_CARE_PROVIDER_SITE_OTHER): Payer: MEDICAID

## 2023-12-06 VITALS — BP 145/85 | HR 88 | Temp 98.9°F | Resp 18 | Ht 67.0 in | Wt 157.8 lb

## 2023-12-06 DIAGNOSIS — D500 Iron deficiency anemia secondary to blood loss (chronic): Secondary | ICD-10-CM | POA: Diagnosis not present

## 2023-12-06 MED ORDER — SODIUM CHLORIDE 0.9 % IV SOLN
200.0000 mg | Freq: Once | INTRAVENOUS | Status: AC
  Administered 2023-12-06: 200 mg via INTRAVENOUS
  Filled 2023-12-06: qty 10

## 2023-12-06 NOTE — Progress Notes (Signed)
 Diagnosis: Iron  Deficiency Anemia  Provider:  Praveen Mannam MD  Procedure: IV Infusion  IV Type: Peripheral, IV Location: R Forearm  Venofer  (Iron  Sucrose), Dose: 200 mg  Infusion Start Time: 0953  Infusion Stop Time: 1015  Post Infusion IV Care: Patient declined observation and Peripheral IV Discontinued  Discharge: Condition: Good, Destination: Home . AVS Provided  Performed by:  Jag Lenz, RN

## 2023-12-14 ENCOUNTER — Ambulatory Visit: Payer: MEDICAID

## 2023-12-14 MED ORDER — DIPHENHYDRAMINE HCL 25 MG PO CAPS: 25.0000 mg | ORAL_CAPSULE | Freq: Once | ORAL | Status: AC

## 2023-12-14 MED ORDER — ACETAMINOPHEN 325 MG PO TABS: 650.0000 mg | ORAL_TABLET | Freq: Once | ORAL | Status: AC

## 2023-12-14 MED ORDER — METHYLPREDNISOLONE SODIUM SUCC 40 MG IJ SOLR: 40.0000 mg | Freq: Once | INTRAMUSCULAR | Status: AC

## 2023-12-27 ENCOUNTER — Ambulatory Visit (INDEPENDENT_AMBULATORY_CARE_PROVIDER_SITE_OTHER): Payer: MEDICAID

## 2023-12-27 VITALS — BP 135/87 | HR 86 | Temp 98.0°F | Resp 18 | Ht 67.0 in | Wt 159.0 lb

## 2023-12-27 DIAGNOSIS — K50919 Crohn's disease, unspecified, with unspecified complications: Secondary | ICD-10-CM | POA: Diagnosis not present

## 2023-12-27 MED ORDER — ACETAMINOPHEN 325 MG PO TABS: 650.0000 mg | ORAL_TABLET | Freq: Once | ORAL | Status: DC

## 2023-12-27 MED ORDER — SODIUM CHLORIDE 0.9 % IV SOLN
5.0000 mg/kg | Freq: Once | INTRAVENOUS | Status: AC
  Administered 2023-12-27: 400 mg via INTRAVENOUS
  Filled 2023-12-27: qty 40

## 2023-12-27 MED ORDER — DIPHENHYDRAMINE HCL 25 MG PO CAPS
25.0000 mg | ORAL_CAPSULE | Freq: Once | ORAL | Status: AC
  Administered 2023-12-27: 25 mg via ORAL
  Filled 2023-12-27: qty 1

## 2023-12-27 MED ORDER — METHYLPREDNISOLONE SODIUM SUCC 40 MG IJ SOLR
40.0000 mg | Freq: Once | INTRAMUSCULAR | Status: AC
  Administered 2023-12-27: 40 mg via INTRAVENOUS
  Filled 2023-12-27: qty 1

## 2023-12-27 NOTE — Progress Notes (Signed)
 Diagnosis: , Crohn's Disease  Provider:  Mannam, Praveen MD  Procedure: IV Infusion  IV Type: Peripheral, IV Location: R Antecubital  , Remicade  (Infliximab ), Dose: 400 mg  Infusion Start Time: 0922  Infusion Stop Time: 1136  Post Infusion IV Care: Peripheral IV Discontinued  Discharge: Condition: Good, Destination: Home . AVS Provided  Performed by:  Brandin Dilday, RN

## 2023-12-31 ENCOUNTER — Telehealth: Payer: MEDICAID

## 2023-12-31 ENCOUNTER — Ambulatory Visit: Payer: MEDICAID

## 2023-12-31 DIAGNOSIS — I10 Essential (primary) hypertension: Secondary | ICD-10-CM | POA: Diagnosis not present

## 2023-12-31 MED ORDER — LOSARTAN POTASSIUM 25 MG PO TABS: 25.0000 mg | ORAL_TABLET | Freq: Every day | ORAL | 0 refills | Status: AC

## 2023-12-31 NOTE — Progress Notes (Signed)
 Virtual Visit Consent   Dawn Walsh, you are scheduled for a virtual visit with a [facility] provider today. Just as with appointments in the office, your consent must be obtained to participate. Your consent will be active for this visit and any virtual visit you may have with one of our providers in the next 365 days. If you have a MyChart account, a copy of this consent can be sent to you electronically.  As this is a virtual visit, video technology does not allow for your provider to perform a traditional examination. This may limit your provider's ability to fully assess your condition. If your provider identifies any concerns that need to be evaluated in person or the need to arrange testing (such as labs, EKG, etc.), we will make arrangements to do so. Although advances in technology are sophisticated, we cannot ensure that it will always work on either your end or our end. If the connection with a video visit is poor, the visit may have to be switched to a telephone visit. With either a video or telephone visit, we are not always able to ensure that we have a secure connection.  By engaging in this virtual visit, you consent to the provision of healthcare and authorize for your insurance to be billed (if applicable) for the services provided during this visit. Depending on your insurance coverage, you may receive a charge related to this service.  I need to obtain your verbal consent now. Are you willing to proceed with your visit today? Dawn Walsh has provided verbal consent on 12/31/2023 for a virtual visit (video or telephone). Dawn GORMAN Sage, PA-C  Date: 12/31/2023 8:39 AM   Virtual Visit via Video Note   I, Dawn GORMAN Mayers, connected with  Dawn Walsh  ([id number], 07-07-1975) on 12/31/23 at  8:30 AM EST by a video-enabled telemedicine application and verified that I am speaking with the correct person using two identifiers.  Location: Patient: Virtual Visit Location  Patient: Home Provider: Virtual Visit Location Provider: Home Office   I discussed the limitations of evaluation and management by telemedicine and the availability of in person appointments. The patient expressed understanding and agreed to proceed.    History of Present Illness: Dawn Walsh is a 48 y.o. who identifies as a female who was assigned female at birth, and is being seen today  running out of medication.  She has been out of losartan  25 mg for about a month. Since then her morning blood pressures feel very high with dizziness and swelling of her eyes and hands. She does not have a way to check blood pressure at home.  She receives Remicade  infusions for Crohn's disease every three to six weeks, where her blood pressure is checked and sometimes must come down before she can receive treatment. She is worried about missing work when she feels unwell from uncontrolled blood pressure.  She has a new primary care provider but cannot get an appointment until after January. Her [employer] coverage limits provider options.  Problems:  Patient Active Problem List   Diagnosis Date Noted   Iron  deficiency anemia 02/14/2023   Crohn's disease with complication (HCC) 08/05/2022   Ileitis 05/24/2022   Symptomatic anemia 05/23/2022   Abdominal pain 05/23/2022   Nausea and vomiting 05/23/2022   Unintentional weight loss 05/23/2022   Anemia 06/04/2021   LLQ pain 06/04/2021    Allergies:  Allergies  Allergen Reactions   Morphine And Codeine Itching   Medications:  Current Outpatient Medications:    cyanocobalamin  (VITAMIN B12) 250 MCG tablet, Take 1 tablet (250 mcg total) by mouth daily. (Patient taking differently: Take 250 mcg by mouth daily. Gummies), Disp: , Rfl:    inFLIXimab  300 mg in sodium chloride  0.9 % 220 mL, Inject 300 mg into the vein every 8 (eight) weeks., Disp: , Rfl:    losartan  (COZAAR ) 25 MG tablet, Take 1 tablet (25 mg total) by mouth daily., Disp: 30 tablet, Rfl:  0   ondansetron  (ZOFRAN ) 4 MG tablet, Take 1 tablet (4 mg total) by mouth every 8 (eight) hours as needed for nausea or vomiting., Disp: 30 tablet, Rfl: 2   pantoprazole  (PROTONIX ) 40 MG tablet, Take 1 tablet (40 mg total) by mouth daily., Disp: 30 tablet, Rfl: 2   promethazine  (PHENERGAN ) 12.5 MG tablet, Take 1 tablet (12.5 mg total) by mouth every 12 (twelve) hours as needed for nausea or vomiting., Disp: 30 tablet, Rfl: 2   Vitamin D , Ergocalciferol , (DRISDOL ) 1.25 MG (50000 UNIT) CAPS capsule, TAKE 1 CAPSULE BY MOUTH EVERY 7 DAYS, Disp: 5 capsule, Rfl: 3  Current Facility-Administered Medications:    sodium chloride  0.9 % bolus 250 mL, 250 mL, Intravenous, Once, Mannam, Praveen, MD  Facility-Administered Medications Ordered in Other Visits:    acetaminophen  (TYLENOL ) tablet 650 mg, 650 mg, Oral, Once, James, Teldrin D, RPH   acetaminophen  (TYLENOL ) tablet 650 mg, 650 mg, Oral, Once, James, Teldrin D, RPH   diphenhydrAMINE  (BENADRYL ) capsule 25 mg, 25 mg, Oral, Once, James, Teldrin D, RPH   diphenhydrAMINE  (BENADRYL ) capsule 25 mg, 25 mg, Oral, Once, James, Teldrin D, RPH   methylPREDNISolone  sodium succinate (SOLU-MEDROL ) 40 mg/mL injection 40 mg, 40 mg, Intravenous, Once, James, Teldrin D, RPH   methylPREDNISolone  sodium succinate (SOLU-MEDROL ) 40 mg/mL injection 40 mg, 40 mg, Intravenous, Once, James, Teldrin D, RPH  Observations/Objective: Patient is well-developed, well-nourished in no acute distress.  Resting comfortably  at home.  Head is normocephalic, atraumatic.  No labored breathing.  Speech is clear and coherent with logical content.  Patient is alert and oriented at baseline.    Assessment and Plan: 1. Essential hypertension (Primary) - losartan  (COZAAR ) 25 MG tablet; Take 1 tablet (25 mg total) by mouth daily.  Dispense: 30 tablet; Refill: 0   Hypertension poorly controlled due to lack of medication. Reports morning elevated blood pressure with dizziness and swelling.  Awaiting new primary care provider appointment. - Prescribed losartan  25 mg to [facility] on [address]. - Arranged mobile medicine unit visit next week for blood pressure monitoring and primary care provider assistance. - Advised obtaining home blood pressure kit.  Follow Up Instructions: I discussed the assessment and treatment plan with the patient. The patient was provided an opportunity to ask questions and all were answered. The patient agreed with the plan and demonstrated an understanding of the instructions.  A copy of instructions were sent to the patient via MyChart unless otherwise noted below.     The patient was advised to call back or seek an in-person evaluation if the symptoms worsen or if the condition fails to improve as anticipated.    Dawn RAMAN Mayers, PA-C

## 2023-12-31 NOTE — Patient Instructions (Signed)
 Dawn Walsh, thank you for joining [name], PA-C for today's virtual visit.  While this provider is not your primary care provider (PCP), if your PCP is located in our provider database this encounter information will be shared with them immediately following your visit.   A [facility] MyChart account gives you access to today's visit and all your visits, tests, and labs performed at [facility]  click here if you don't have a [facility] MyChart account or go to [email or web address]  Consent: (Patient) Dawn Walsh provided verbal consent for this virtual visit at the beginning of the encounter.  Current Medications:  Current Outpatient Medications:    cyanocobalamin  (VITAMIN B12) 250 MCG tablet, Take 1 tablet (250 mcg total) by mouth daily. (Patient taking differently: Take 250 mcg by mouth daily. Gummies), Disp: , Rfl:    inFLIXimab  300 mg in sodium chloride  0.9 % 220 mL, Inject 300 mg into the vein every 8 (eight) weeks., Disp: , Rfl:    losartan  (COZAAR ) 25 MG tablet, Take 1 tablet (25 mg total) by mouth daily., Disp: 30 tablet, Rfl: 0   ondansetron  (ZOFRAN ) 4 MG tablet, Take 1 tablet (4 mg total) by mouth every 8 (eight) hours as needed for nausea or vomiting., Disp: 30 tablet, Rfl: 2   pantoprazole  (PROTONIX ) 40 MG tablet, Take 1 tablet (40 mg total) by mouth daily., Disp: 30 tablet, Rfl: 2   promethazine  (PHENERGAN ) 12.5 MG tablet, Take 1 tablet (12.5 mg total) by mouth every 12 (twelve) hours as needed for nausea or vomiting., Disp: 30 tablet, Rfl: 2   Vitamin D , Ergocalciferol , (DRISDOL ) 1.25 MG (50000 UNIT) CAPS capsule, TAKE 1 CAPSULE BY MOUTH EVERY 7 DAYS, Disp: 5 capsule, Rfl: 3  Current Facility-Administered Medications:    sodium chloride  0.9 % bolus 250 mL, 250 mL, Intravenous, Once, Mannam, Praveen, MD  Facility-Administered Medications Ordered in Other Visits:    acetaminophen  (TYLENOL ) tablet 650 mg, 650 mg, Oral, Once, James, Teldrin  D, RPH   acetaminophen  (TYLENOL ) tablet 650 mg, 650 mg, Oral, Once, James, Teldrin D, RPH   diphenhydrAMINE  (BENADRYL ) capsule 25 mg, 25 mg, Oral, Once, James, Teldrin D, RPH   diphenhydrAMINE  (BENADRYL ) capsule 25 mg, 25 mg, Oral, Once, James, Teldrin D, RPH   methylPREDNISolone  sodium succinate (SOLU-MEDROL ) 40 mg/mL injection 40 mg, 40 mg, Intravenous, Once, James, Teldrin D, RPH   methylPREDNISolone  sodium succinate (SOLU-MEDROL ) 40 mg/mL injection 40 mg, 40 mg, Intravenous, Once, James, Teldrin D, RPH   Medications ordered in this encounter:  Meds ordered this encounter  Medications   losartan  (COZAAR ) 25 MG tablet    Sig: Take 1 tablet (25 mg total) by mouth daily.    Dispense:  30 tablet    Refill:  0    Supervising Provider:   LAMPTEY, PHILIP O [[id number]]     *If you need refills on other medications prior to your next appointment, please contact your pharmacy*  Follow-Up: Call back or seek an in-person evaluation if the symptoms worsen or if the condition fails to improve as anticipated.  [facility] [phone]  Other Instructions  YOUR PLAN:  -HYPERTENSION: Hypertension means high blood pressure, which can cause symptoms like dizziness and swelling if not controlled. We have prescribed losartan  25 mg to help manage your blood pressure. You can pick it up at [facility] on [address]. We have also arranged for a mobile medicine unit to visit you next week to monitor your blood pressure and assist  with primary care needs. Our staff will call you next Monday with the location of the mobile unit. Additionally, we recommend getting a home blood pressure kit to keep track of your blood pressure regularly.   If you have been instructed to have an in-person evaluation today at a local Urgent Care facility, please use the link below. It will take you to a list of all of our available [facility], including address, phone number and hours of operation.  Please do not delay care.  [facility]  If you or a family member do not have a primary care provider, use the link below to schedule a visit and establish care. When you choose a [facility] primary care physician or advanced practice provider, you gain a long-term partner in health. Find a Primary Care Provider  Learn more about [facility]'s in-office and virtual care options: [facility] - Get Care Now

## 2024-01-25 ENCOUNTER — Telehealth: Payer: Self-pay

## 2024-01-25 NOTE — Telephone Encounter (Signed)
 Auth Submission: NO AUTH NEEDED Site of care: Site of care: [facility] Payer: [employer] medicaid Medication & CPT/J Code(s) submitted: Remicade  (Infliximab ) J1745 Diagnosis Code:  Route of submission (phone, fax, portal):  Phone # Fax # Auth type: Buy/Bill PB Units/visits requested: 400mg  x 6 doses Reference number:  Approval from: 01/25/24 to 01/23/25

## 2024-02-08 ENCOUNTER — Other Ambulatory Visit: Payer: Self-pay | Admitting: Physician Assistant

## 2024-02-08 DIAGNOSIS — I10 Essential (primary) hypertension: Secondary | ICD-10-CM

## 2024-02-21 ENCOUNTER — Ambulatory Visit: Payer: MEDICAID

## 2024-02-21 MED ORDER — ACETAMINOPHEN 325 MG PO TABS: 650.0000 mg | ORAL_TABLET | Freq: Once | ORAL | Status: AC

## 2024-02-21 MED ORDER — DIPHENHYDRAMINE HCL 25 MG PO CAPS: 25.0000 mg | ORAL_CAPSULE | Freq: Once | ORAL | Status: AC

## 2024-02-21 MED ORDER — METHYLPREDNISOLONE SODIUM SUCC 40 MG IJ SOLR: 40.0000 mg | Freq: Once | INTRAMUSCULAR | Status: AC

## 2024-03-01 ENCOUNTER — Inpatient Hospital Stay: Payer: MEDICAID | Admitting: Physician Assistant

## 2024-03-01 ENCOUNTER — Inpatient Hospital Stay: Payer: MEDICAID

## 2024-03-01 ENCOUNTER — Telehealth: Payer: Self-pay | Admitting: Physician Assistant

## 2024-03-01 DIAGNOSIS — D508 Other iron deficiency anemias: Secondary | ICD-10-CM

## 2024-03-01 NOTE — Telephone Encounter (Signed)
 Pt left a voicemail, I returned the call to reschedule appt

## 2024-03-06 ENCOUNTER — Ambulatory Visit: Payer: MEDICAID

## 2024-03-13 ENCOUNTER — Ambulatory Visit (INDEPENDENT_AMBULATORY_CARE_PROVIDER_SITE_OTHER): Payer: Self-pay | Admitting: Primary Care

## 2024-03-27 ENCOUNTER — Inpatient Hospital Stay: Payer: MEDICAID | Admitting: Physician Assistant

## 2024-03-27 ENCOUNTER — Inpatient Hospital Stay: Payer: MEDICAID | Attending: Physician Assistant
